# Patient Record
Sex: Male | Born: 1945 | Race: Black or African American | Hispanic: No | Smoking: Former smoker
Health system: Southern US, Community
[De-identification: ages and names within clinical notes are randomized; demographics above are authoritative.]

## PROBLEM LIST (undated history)

## (undated) DIAGNOSIS — I10 Essential (primary) hypertension: Secondary | ICD-10-CM

## (undated) DIAGNOSIS — J449 Chronic obstructive pulmonary disease, unspecified: Secondary | ICD-10-CM

---

## 2004-05-10 ENCOUNTER — Ambulatory Visit (HOSPITAL_COMMUNITY): Admission: RE | Admit: 2004-05-10 | Discharge: 2004-05-10 | Payer: Self-pay | Admitting: Gastroenterology

## 2004-05-10 ENCOUNTER — Encounter (INDEPENDENT_AMBULATORY_CARE_PROVIDER_SITE_OTHER): Payer: Self-pay | Admitting: Specialist

## 2006-10-31 ENCOUNTER — Emergency Department (HOSPITAL_COMMUNITY): Admission: EM | Admit: 2006-10-31 | Discharge: 2006-10-31 | Payer: Self-pay | Admitting: Emergency Medicine

## 2008-08-11 ENCOUNTER — Emergency Department (HOSPITAL_COMMUNITY): Admission: EM | Admit: 2008-08-11 | Discharge: 2008-08-11 | Payer: Self-pay | Admitting: Emergency Medicine

## 2011-01-19 NOTE — Op Note (Signed)
[name], Keith Morales                           ACCOUNT NO.:  [id number]   MEDICAL RECORD NO.:  [id number]                   PATIENT TYPE:  AMB   LOCATION:  ENDO                                 FACILITY:  [facility]   PHYSICIAN:  Anselmo Rod, M.D.               DATE OF BIRTH:  17-Oct-1945   DATE OF PROCEDURE:  05/10/2004  DATE OF DISCHARGE:                                 OPERATIVE REPORT   PROCEDURE:  Colonoscopy with biopsies x6.   ENDOSCOPIST:  Anselmo Rod, M.D.   INSTRUMENT USED:  Olympus video colonoscope.   INDICATIONS FOR PROCEDURE:  This 65 year old. African-American male  undergoing a screening colonoscopy.  The patient has had adenomatous polyps  removed in 1994.   PRE-PROCEDURE PREPARATION:  An informed consent was procured from the  patient and the patient was fasted for eight hours prior to the procedure  and prepped with a bottle of magnesium citrate and one gallon of GoLYTELY on  the night prior to the procedure.   PRE-PROCEDURE PHYSICAL EXAMINATION:  VITAL SIGNS:  Stable vital signs.  NECK:  Supple.  CHEST:  Clear to auscultation.  HEART:  S1, S2 regular.  ABDOMEN:  Soft with normal bowel sounds.   DESCRIPTION OF PROCEDURE:  The patient was placed in the left lateral  decubitus position and sedated with 90 mg of Demerol and 9 mg of Versed in  slow incremental doses.  Once the patient was adequately sedated and  maintained on low-flow oxygen and continuous cardiac monitoring, the Olympus  video colonoscope was advanced from the rectum to the cecum with difficulty.  There was some residual stool in the colon.  Solid stool was seen in some  parts of the colon, both in the transverse and in the left colon.  There was  some stool seen in the cecum.  Multiple washings were done.  The patient's  position was changed from the left lateral to the supine position to  mobilize the stool pool.  A small sessile polyp was biopsied x2 from the mid-  right colon.  Five small  sessile polyps were biopsied from the rectosigmoid  colon.  Prominent internal hemorrhoids were seen on retroflexion.  Small  lesions could have been missed, secondary to a relatively poor prep.   IMPRESSION:  1.  Multiple small sessile polyps, biopsied, five from the rectosigmoid      colon and one from the mid-right colon.  2.  A few early left-sided diverticula.   RECOMMENDATIONS:  1.  Await the pathology results.  2.  Avoid nonsteroidals including aspirin for the next two weeks.  3.  Outpatient followup in the next two weeks for further recommendations.  Anselmo Rod, M.D.    JNM/MEDQ  D:  05/10/2004  T:  05/10/2004  Job:  [id number]   cc:   Skeet Latch  [address]  [place]  [place] [address]  Fax: [phone]

## 2011-06-08 LAB — URINALYSIS, ROUTINE W REFLEX MICROSCOPIC
Nitrite: NEGATIVE
Protein, ur: >300 mg/dL — AB
Specific Gravity, Urine: 1.036 — ABNORMAL HIGH (ref 1.005–1.030)
Urobilinogen, UA: 1 mg/dL (ref 0.0–1.0)

## 2011-06-08 LAB — DIFFERENTIAL
Band Neutrophils: 0 % (ref 0–10)
Basophils Absolute: 0.3 10*3/uL — ABNORMAL HIGH (ref 0.0–0.1)
Basophils Relative: 1 % (ref 0–1)
Eosinophils Absolute: 0 10*3/uL (ref 0.0–0.7)
Eosinophils Relative: 0 % (ref 0–5)
Lymphs Abs: 1.9 10*3/uL (ref 0.7–4.0)
Myelocytes: 0 %
Neutro Abs: 23.4 10*3/uL — ABNORMAL HIGH (ref 1.7–7.7)
Neutrophils Relative %: 87 % — ABNORMAL HIGH (ref 43–77)
Promyelocytes Absolute: 0 %

## 2011-06-08 LAB — COMPREHENSIVE METABOLIC PANEL
AST: 22 U/L (ref 0–37)
Albumin: 3.7 g/dL (ref 3.5–5.2)
Alkaline Phosphatase: 96 U/L (ref 39–117)
Chloride: 101 mEq/L (ref 96–112)
GFR calc Af Amer: >60 mL/min (ref >60)
Potassium: 4.3 mEq/L (ref 3.5–5.1)
Sodium: 134 mEq/L — ABNORMAL LOW (ref 135–145)
Total Bilirubin: 1.8 mg/dL — ABNORMAL HIGH (ref 0.3–1.2)
Total Protein: 8.4 g/dL — ABNORMAL HIGH (ref 6.0–8.3)

## 2011-06-08 LAB — CBC
Platelets: 321 10*3/uL (ref 150–400)
RDW: 12.8 % (ref 11.5–15.5)
WBC: 27 10*3/uL — ABNORMAL HIGH (ref 4.0–10.5)

## 2011-11-23 ENCOUNTER — Emergency Department (HOSPITAL_COMMUNITY)
Admission: EM | Admit: 2011-11-23 | Discharge: 2011-11-23 | Disposition: A | Discharge status: Home / Self Care | Payer: Medicare Other | Attending: Emergency Medicine | Admitting: Emergency Medicine

## 2011-11-23 ENCOUNTER — Encounter (HOSPITAL_COMMUNITY): Payer: Self-pay | Admitting: Emergency Medicine

## 2011-11-23 DIAGNOSIS — R197 Diarrhea, unspecified: Secondary | ICD-10-CM | POA: Insufficient documentation

## 2011-11-23 DIAGNOSIS — I10 Essential (primary) hypertension: Secondary | ICD-10-CM | POA: Insufficient documentation

## 2011-11-23 HISTORY — DX: Essential (primary) hypertension: I10

## 2011-11-23 LAB — POCT I-STAT, CHEM 8
BUN: 11 mg/dL (ref 6–23)
Calcium, Ion: 1.22 mmol/L (ref 1.12–1.32)
Chloride: 110 mEq/L (ref 96–112)
Creatinine, Ser: 0.9 mg/dL (ref 0.50–1.35)
Glucose, Bld: 92 mg/dL (ref 70–99)
Potassium: 3.6 mEq/L (ref 3.5–5.1)

## 2011-11-23 MED ORDER — SODIUM CHLORIDE 0.9 % IV BOLUS (SEPSIS)
1000.0000 mL | Freq: Once | INTRAVENOUS | Status: AC
  Administered 2011-11-23: 1000 mL via INTRAVENOUS

## 2011-11-23 NOTE — ED Provider Notes (Signed)
I saw and evaluated the patient, reviewed the resident's note and I agree with the findings and plan.  Lytes checked and are nl--pt feels better after fluids, will d/c  Toy Baker, MD 11/23/11 2106

## 2011-11-23 NOTE — Discharge Instructions (Signed)
B.R.A.T. Diet Your doctor has recommended the B.R.A.T. diet for you or your child until the condition improves. This is often used to help control diarrhea and vomiting symptoms. If you or your child can tolerate clear liquids, you may have:  Bananas.   Rice.   Applesauce.   Toast (and other simple starches such as crackers, potatoes, noodles).  Be sure to avoid dairy products, meats, and fatty foods until symptoms are better. Fruit juices such as apple, grape, and prune juice can make diarrhea worse. Avoid these. Continue this diet for 2 days or as instructed by your caregiver. Document Released: 08/20/2005 Document Revised: 08/09/2011 Document Reviewed: 02/06/2007 ExitCare Patient Information 2012 [employer].  Return for any new or worsening symptoms or any other concerns.  

## 2011-11-23 NOTE — ED Provider Notes (Signed)
History     CSN: [id number]  Arrival date & time 11/23/11  1621   First MD Initiated Contact with Patient 11/23/11 1758      Chief Complaint  Patient presents with  . Diarrhea    (Consider location/radiation/quality/duration/timing/severity/associated sxs/prior treatment) HPI Cc diarrhea since last pm, multiple episodes today, no vomiting, no pain.  Sx's moderate, no aggravating or alleviating factors.   Past Medical History  Diagnosis Date  . Hypertension     History reviewed. No pertinent past surgical history.  No family history on file.  History  Substance Use Topics  . Smoking status: Never Smoker   . Smokeless tobacco: Not on file  . Alcohol Use: No      Review of Systems  Constitutional: Negative for fever and fatigue.  Respiratory: Negative for shortness of breath.   Gastrointestinal: Positive for diarrhea. Negative for nausea, vomiting and abdominal pain.  Genitourinary: Negative for dysuria.  All other systems reviewed and are negative.    Allergies  Review of patient's allergies indicates no known allergies.  Home Medications   Current Outpatient Rx  Name Route Sig Dispense Refill  . AMLODIPINE BESY-BENAZEPRIL HCL 10-40 MG PO CAPS Oral Take 1 capsule by mouth daily.      BP 128/69  Pulse 87  Temp(Src) 99.1 F (37.3 C) (Oral)  Resp 14  SpO2 99%RA wnl  Physical Exam  Nursing note and vitals reviewed. Constitutional: He appears well-developed and well-nourished.  HENT:  Head: Normocephalic and atraumatic.  Eyes: Right eye exhibits no discharge. Left eye exhibits no discharge.  Neck: Normal range of motion. Neck supple.  Cardiovascular: Normal rate, regular rhythm and normal heart sounds.   Pulmonary/Chest: Effort normal and breath sounds normal.  Abdominal: Soft. There is no tenderness.  Musculoskeletal: Normal range of motion. He exhibits no tenderness.  Neurological: He is alert.  Skin: Skin is warm and dry.  Psychiatric: He has a  normal mood and affect. His behavior is normal.    ED Course  Procedures (including critical care time)   Labs Reviewed  POCT I-STAT, CHEM 8  LAB REPORT - SCANNED   No results found.   1. Diarrhea       MDM  Pt is in nad, afvss, nontoxic appearing, exam and hx as above. Pt here with multiple episodes of diarrhea, no vomiting, abd benign, will check elytes, giving ivf's.     Pt feels better after fluids, results d/w pt, return warnings given.    Elijio Miles, MD 11/24/11 1534

## 2011-11-23 NOTE — ED Notes (Signed)
Onset today nausea and diarrhea loose stool multiple episodes denies any pain ax4.

## 2011-11-23 NOTE — ED Notes (Signed)
Patient states he is having diarrhea and nausea starting last nigh and today. Last episode of diarrhea was at 1500 today. Patient denies any pain in general and no SOB. Family at the bedside and patient resting with NAD at this time.

## 2011-11-25 NOTE — ED Provider Notes (Signed)
I saw and evaluated the patient, reviewed the resident's note and I agree with the findings and plan.  Toy Baker, MD 11/25/11 [phone]

## 2012-11-11 ENCOUNTER — Encounter (HOSPITAL_COMMUNITY): Payer: Self-pay | Admitting: Physical Medicine and Rehabilitation

## 2012-11-11 ENCOUNTER — Emergency Department (HOSPITAL_COMMUNITY)
Admission: EM | Admit: 2012-11-11 | Discharge: 2012-11-11 | Disposition: A | Discharge status: Home / Self Care | Payer: No Typology Code available for payment source | Attending: Emergency Medicine | Admitting: Emergency Medicine

## 2012-11-11 DIAGNOSIS — Z79899 Other long term (current) drug therapy: Secondary | ICD-10-CM | POA: Insufficient documentation

## 2012-11-11 DIAGNOSIS — S46909A Unspecified injury of unspecified muscle, fascia and tendon at shoulder and upper arm level, unspecified arm, initial encounter: Secondary | ICD-10-CM | POA: Insufficient documentation

## 2012-11-11 DIAGNOSIS — I10 Essential (primary) hypertension: Secondary | ICD-10-CM | POA: Insufficient documentation

## 2012-11-11 DIAGNOSIS — Y92410 Unspecified street and highway as the place of occurrence of the external cause: Secondary | ICD-10-CM | POA: Insufficient documentation

## 2012-11-11 DIAGNOSIS — S0993XA Unspecified injury of face, initial encounter: Secondary | ICD-10-CM | POA: Insufficient documentation

## 2012-11-11 DIAGNOSIS — Y9389 Activity, other specified: Secondary | ICD-10-CM | POA: Insufficient documentation

## 2012-11-11 MED ORDER — IBUPROFEN 800 MG PO TABS: 800.0000 mg | ORAL_TABLET | Freq: Three times a day (TID) | ORAL | Status: DC
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
## 2012-11-11 NOTE — ED Provider Notes (Signed)
Medical screening examination/treatment/procedure(s) were performed by non-physician practitioner and as supervising physician I was immediately available for consultation/collaboration.   Carleene Cooper III, MD 11/11/12 [phone]
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
## 2012-12-02 ENCOUNTER — Encounter (HOSPITAL_COMMUNITY): Payer: Self-pay | Admitting: *Deleted

## 2012-12-02 ENCOUNTER — Emergency Department (HOSPITAL_COMMUNITY)
Admission: EM | Admit: 2012-12-02 | Discharge: 2012-12-02 | Disposition: A | Discharge status: Home / Self Care | Payer: Medicare Other | Attending: Emergency Medicine | Admitting: Emergency Medicine

## 2012-12-02 ENCOUNTER — Emergency Department (HOSPITAL_COMMUNITY): Payer: Medicare Other

## 2012-12-02 DIAGNOSIS — Z79899 Other long term (current) drug therapy: Secondary | ICD-10-CM | POA: Insufficient documentation

## 2012-12-02 DIAGNOSIS — S20219A Contusion of unspecified front wall of thorax, initial encounter: Secondary | ICD-10-CM | POA: Insufficient documentation

## 2012-12-02 DIAGNOSIS — S20211A Contusion of right front wall of thorax, initial encounter: Secondary | ICD-10-CM

## 2012-12-02 DIAGNOSIS — I10 Essential (primary) hypertension: Secondary | ICD-10-CM | POA: Insufficient documentation

## 2012-12-02 MED ORDER — HYDROCODONE-ACETAMINOPHEN 5-325 MG PO TABS: 1.0000 | ORAL_TABLET | Freq: Four times a day (QID) | ORAL | Status: DC | PRN
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
## 2012-12-02 NOTE — ED Provider Notes (Signed)
Medical screening examination/treatment/procedure(s) were performed by non-physician practitioner and as supervising physician I was immediately available for consultation/collaboration.   Gavin Pound. Oletta Lamas, MD 12/02/12 2156
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
## 2014-09-09 ENCOUNTER — Emergency Department (HOSPITAL_COMMUNITY): Payer: Medicare Other

## 2014-09-09 ENCOUNTER — Encounter (HOSPITAL_COMMUNITY): Payer: Self-pay | Admitting: Cardiology

## 2014-09-09 ENCOUNTER — Emergency Department (HOSPITAL_COMMUNITY)
Admission: EM | Admit: 2014-09-09 | Discharge: 2014-09-09 | Disposition: A | Discharge status: Home / Self Care | Payer: Medicare Other | Attending: Emergency Medicine | Admitting: Emergency Medicine

## 2014-09-09 DIAGNOSIS — Y9389 Activity, other specified: Secondary | ICD-10-CM | POA: Diagnosis not present

## 2014-09-09 DIAGNOSIS — S99921A Unspecified injury of right foot, initial encounter: Secondary | ICD-10-CM | POA: Diagnosis present

## 2014-09-09 DIAGNOSIS — I10 Essential (primary) hypertension: Secondary | ICD-10-CM | POA: Diagnosis not present

## 2014-09-09 DIAGNOSIS — S92354A Nondisplaced fracture of fifth metatarsal bone, right foot, initial encounter for closed fracture: Secondary | ICD-10-CM | POA: Insufficient documentation

## 2014-09-09 DIAGNOSIS — Y9289 Other specified places as the place of occurrence of the external cause: Secondary | ICD-10-CM | POA: Insufficient documentation

## 2014-09-09 DIAGNOSIS — S92301A Fracture of unspecified metatarsal bone(s), right foot, initial encounter for closed fracture: Secondary | ICD-10-CM

## 2014-09-09 DIAGNOSIS — Y998 Other external cause status: Secondary | ICD-10-CM | POA: Diagnosis not present

## 2014-09-09 DIAGNOSIS — W010XXA Fall on same level from slipping, tripping and stumbling without subsequent striking against object, initial encounter: Secondary | ICD-10-CM | POA: Diagnosis not present

## 2014-09-09 DIAGNOSIS — Z79899 Other long term (current) drug therapy: Secondary | ICD-10-CM | POA: Diagnosis not present

## 2014-09-09 NOTE — ED Notes (Signed)
Pt reports that he slipped on christmas eve and hurt this right foot. Pt reports swelling and pain with walking.

## 2014-09-09 NOTE — Discharge Instructions (Signed)
Metatarsal Fracture, Undisplaced  A metatarsal fracture is a break in the bone(s) of the foot. These are the bones of the foot that connect your toes to the bones of the ankle.  DIAGNOSIS   The diagnoses of these fractures are usually made with X-rays. If there are problems in the forefoot and x-rays are normal a later bone scan will usually make the diagnosis.   TREATMENT AND HOME CARE INSTRUCTIONS  · Treatment may or may not include a cast or walking shoe. When casts are needed the use is usually for short periods of time so as not to slow down healing with muscle wasting (atrophy).  · Activities should be stopped until further advised by your caregiver.  · Wear shoes with adequate shock absorbing capabilities and stiff soles.  · Alternative exercise may be undertaken while waiting for healing. These may include bicycling and swimming, or as your caregiver suggests.  · It is important to keep all follow-up visits or specialty referrals. The failure to keep these appointments could result in improper bone healing and chronic pain or disability.  · Warning: Do not drive a car or operate a motor vehicle until your caregiver specifically tells you it is safe to do so.  IF YOU DO NOT HAVE A CAST OR SPLINT:  · You may walk on your injured foot as tolerated or advised.  · Do not put any weight on your injured foot for as long as directed by your caregiver. Slowly increase the amount of time you walk on the foot as the pain allows or as advised.  · Use crutches until you can bear weight without pain. A gradual increase in weight bearing may help.  · Apply ice to the injury for 15-20 minutes each hour while awake for the first 2 days. Put the ice in a plastic bag and place a towel between the bag of ice and your skin.  · Only take over-the-counter or prescription medicines for pain, discomfort, or fever as directed by your caregiver.  SEEK IMMEDIATE MEDICAL CARE IF:   · Your cast gets damaged or breaks.  · You have  continued severe pain or more swelling than you did before the cast was put on, or the pain is not controlled with medications.  · Your skin or nails below the injury turn blue or grey, or feel cold or numb.  · There is a bad smell, or new stains or pus-like (purulent) drainage coming from the cast.  MAKE SURE YOU:   · Understand these instructions.  · Will watch your condition.  · Will get help right away if you are not doing well or get worse.  Document Released: 05/12/2002 Document Revised: 11/12/2011 Document Reviewed: 04/02/2008  ExitCare® Patient Information ©2015 [employer]. This information is not intended to replace advice given to you by your health care provider. Make sure you discuss any questions you have with your health care provider.

## 2014-09-09 NOTE — ED Provider Notes (Signed)
CSN: [id number]     Arrival date & time 09/09/14  1208 History  This chart was scribed for non-physician practitioner, Margarita Mail, PA-C, working with Orlie Dakin, MD by Ladene Artist, ED Scribe. This patient was seen in room TR09C/TR09C and the patient's care was started at 2:50 PM.   Chief Complaint  Patient presents with  . Foot Pain   The history is provided by the patient. No language interpreter was used.   HPI Comments: Keith Morales is a 69 y.o. male, with a h/o HTN, who presents to the Emergency Department complaining of constant R foot pain onset 2 weeks ago. Pt states that he slipped and fell on 08/28/14. He reports twisting his foot upon falling. Pt reports associated joint swelling to the area. Pain is unchanged with driving.   PCP: Miguel Aschoff, MD  Past Medical History  Diagnosis Date  . Hypertension    History reviewed. No pertinent past surgical history. History reviewed. No pertinent family history. History  Substance Use Topics  . Smoking status: Never Smoker   . Smokeless tobacco: Not on file  . Alcohol Use: No    Review of Systems  Musculoskeletal: Positive for joint swelling and arthralgias.   Allergies  Review of patient's allergies indicates no known allergies.  Home Medications   Prior to Admission medications   Medication Sig Start Date End Date Taking? Authorizing Provider  amLODipine-benazepril (LOTREL) 10-40 MG per capsule Take 1 capsule by mouth daily.    Historical Provider, MD  HYDROcodone-acetaminophen (NORCO/VICODIN) 5-325 MG per tablet Take 1-2 tablets by mouth every 6 (six) hours as needed for pain. 12/02/12   Heather Laisure, PA-C  ibuprofen (ADVIL,MOTRIN) 800 MG tablet Take 800 mg by mouth every 8 (eight) hours as needed for pain.    Historical Provider, MD   BP 148/115 mmHg  Pulse 71  Temp(Src) 98.6 F (37 C) (Oral)  Resp 18  Ht 5\' 7"  (1.702 m)  Wt 140 lb (63.504 kg)  BMI 21.92 kg/m2  SpO2 99% Physical Exam  Constitutional:  He is oriented to person, place, and time. He appears well-developed and well-nourished. No distress.  HENT:  Head: Normocephalic and atraumatic.  Eyes: Conjunctivae and EOM are normal.  Neck: Neck supple.  Cardiovascular: Normal rate.   Pulmonary/Chest: Effort normal.  Musculoskeletal: Normal range of motion.  Tenderness to palpation over the base of the 5th metatarsal. No swelling. Able to ambulate.   Neurological: He is alert and oriented to person, place, and time.  Skin: Skin is warm and dry.  Psychiatric: He has a normal mood and affect. His behavior is normal.  Nursing note and vitals reviewed.  ED Course  Procedures (including critical care time) DIAGNOSTIC STUDIES: Oxygen Saturation is 99% on RA, normal by my interpretation.    COORDINATION OF CARE: 2:54 PM-Discussed treatment plan which includes XR, cam walker and follow-up with orthopedist with pt at bedside and pt agreed to plan.   Labs Review Labs Reviewed - No data to display  Imaging Review Dg Foot Complete Right  09/09/2014   CLINICAL DATA:  Tripped and fell, injuring the right foot on 08/26/2014. Persistent pain involving the lateral foot. Initial encounter.  EXAM: RIGHT FOOT COMPLETE - 3+ VIEW  COMPARISON:  None.  FINDINGS: Nondisplaced fracture involving the base of the 5th metatarsal. No other fractures. Joint space narrowing and associated hypertrophic spurring involving the 1st MTP joint without associated erosions. Narrowing of IP joints in multiple toes. Remaining joint spaces well preserved. Bone  mineral density well-preserved. Large accessory ossicle adjacent to the tarsal navicular.  IMPRESSION: 1. Acute/subacute traumatic fracture involving the base the 5th metatarsal. 2. 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 Mild osteoarthritis involving the IP joints of several toes.   Electronically Signed   By: Evangeline Dakin M.D.   On: 09/09/2014 13:47    EKG Interpretation None      MDM   Final  diagnoses:  Fracture of 5th metatarsal, right, closed, initial encounter   Patient with subacute fracture of the base of the 5th metatarsal. No swelling and ambulating well. This fracture appears stable and well aligned. Patient will be placed in a Cam walker boot. He is to follow up with the orthopod.    I personally performed the services described in this documentation, which was scribed in my presence. The recorded information has been reviewed and is accurate.     Margarita Mail, PA-C 09/09/14 [place], MD 09/10/14 1745

## 2014-09-09 NOTE — ED Notes (Signed)
Pt comfortable with discharge and follow up instructions. Pt declines wheelchair, escorted to waiting area by this RN. No prescriptions. 

## 2020-04-03 ENCOUNTER — Encounter (HOSPITAL_COMMUNITY): Payer: Self-pay

## 2020-04-03 ENCOUNTER — Emergency Department (HOSPITAL_COMMUNITY): Payer: Medicare Other

## 2020-04-03 ENCOUNTER — Emergency Department (HOSPITAL_COMMUNITY)
Admission: EM | Admit: 2020-04-03 | Discharge: 2020-04-04 | Disposition: A | Discharge status: Home / Self Care | Payer: Medicare Other | Attending: Emergency Medicine | Admitting: Emergency Medicine

## 2020-04-03 DIAGNOSIS — Z5321 Procedure and treatment not carried out due to patient leaving prior to being seen by health care provider: Secondary | ICD-10-CM | POA: Insufficient documentation

## 2020-04-03 DIAGNOSIS — R109 Unspecified abdominal pain: Secondary | ICD-10-CM | POA: Diagnosis not present

## 2020-04-03 DIAGNOSIS — R079 Chest pain, unspecified: Secondary | ICD-10-CM | POA: Insufficient documentation

## 2020-04-03 DIAGNOSIS — R630 Anorexia: Secondary | ICD-10-CM | POA: Insufficient documentation

## 2020-04-03 LAB — CBC
HCT: 39.3 % (ref 39.0–52.0)
Hemoglobin: 12.7 g/dL — ABNORMAL LOW (ref 13.0–17.0)
MCH: 29.5 pg (ref 26.0–34.0)
MCHC: 32.3 g/dL (ref 30.0–36.0)
MCV: 91.4 fL (ref 80.0–100.0)
Platelets: 332 10*3/uL (ref 150–400)
RBC: 4.3 MIL/uL (ref 4.22–5.81)
RDW: 12.5 % (ref 11.5–15.5)
WBC: 7 10*3/uL (ref 4.0–10.5)
nRBC: 0 % (ref 0.0–0.2)

## 2020-04-03 NOTE — ED Triage Notes (Signed)
Pt arrives POV for eval of chest pain, abd pain loss of appetite x 3 days. Pt reports he was seen recently and given meds at that time which he states has not helped. Reports started today w/ chest pain approx 3 hours PTA.

## 2020-04-04 DIAGNOSIS — R079 Chest pain, unspecified: Secondary | ICD-10-CM | POA: Diagnosis not present

## 2020-04-04 LAB — BASIC METABOLIC PANEL
Anion gap: 11 (ref 5–15)
BUN: 13 mg/dL (ref 8–23)
CO2: 24 mmol/L (ref 22–32)
Calcium: 9.2 mg/dL (ref 8.9–10.3)
Chloride: 98 mmol/L (ref 98–111)
Creatinine, Ser: 1.12 mg/dL (ref 0.61–1.24)
GFR calc Af Amer: >60 mL/min (ref >60)
GFR calc non Af Amer: >60 mL/min (ref >60)
Glucose, Bld: 119 mg/dL — ABNORMAL HIGH (ref 70–99)
Potassium: 4.1 mmol/L (ref 3.5–5.1)
Sodium: 133 mmol/L — ABNORMAL LOW (ref 135–145)

## 2020-04-04 LAB — TROPONIN I (HIGH SENSITIVITY)
Troponin I (High Sensitivity): 5 ng/L (ref <18)
Troponin I (High Sensitivity): 4 ng/L (ref <18)

## 2020-04-04 NOTE — ED Notes (Signed)
Pt checked out AMA. 

## 2020-04-04 NOTE — ED Notes (Signed)
Pt called x 3  No answer. 

## 2020-04-09 ENCOUNTER — Emergency Department (HOSPITAL_COMMUNITY): Payer: Medicare Other

## 2020-04-09 ENCOUNTER — Inpatient Hospital Stay (HOSPITAL_COMMUNITY)
Admission: EM | Admit: 2020-04-09 | Discharge: 2020-04-16 | DRG: 377 | Disposition: A | Discharge status: Home / Self Care | Payer: Medicare Other | Attending: Internal Medicine | Admitting: Internal Medicine

## 2020-04-09 ENCOUNTER — Encounter (HOSPITAL_COMMUNITY): Payer: Self-pay | Admitting: Radiology

## 2020-04-09 ENCOUNTER — Other Ambulatory Visit: Payer: Self-pay

## 2020-04-09 DIAGNOSIS — R339 Retention of urine, unspecified: Secondary | ICD-10-CM | POA: Diagnosis not present

## 2020-04-09 DIAGNOSIS — K922 Gastrointestinal hemorrhage, unspecified: Secondary | ICD-10-CM | POA: Diagnosis not present

## 2020-04-09 DIAGNOSIS — D62 Acute posthemorrhagic anemia: Secondary | ICD-10-CM | POA: Diagnosis present

## 2020-04-09 DIAGNOSIS — R195 Other fecal abnormalities: Secondary | ICD-10-CM | POA: Diagnosis present

## 2020-04-09 DIAGNOSIS — K648 Other hemorrhoids: Secondary | ICD-10-CM | POA: Diagnosis present

## 2020-04-09 DIAGNOSIS — D120 Benign neoplasm of cecum: Secondary | ICD-10-CM | POA: Diagnosis not present

## 2020-04-09 DIAGNOSIS — D509 Iron deficiency anemia, unspecified: Secondary | ICD-10-CM | POA: Diagnosis present

## 2020-04-09 DIAGNOSIS — I70222 Atherosclerosis of native arteries of extremities with rest pain, left leg: Secondary | ICD-10-CM | POA: Diagnosis present

## 2020-04-09 DIAGNOSIS — D649 Anemia, unspecified: Secondary | ICD-10-CM

## 2020-04-09 DIAGNOSIS — Z681 Body mass index (BMI) 19 or less, adult: Secondary | ICD-10-CM

## 2020-04-09 DIAGNOSIS — K5730 Diverticulosis of large intestine without perforation or abscess without bleeding: Secondary | ICD-10-CM | POA: Diagnosis not present

## 2020-04-09 DIAGNOSIS — D72829 Elevated white blood cell count, unspecified: Secondary | ICD-10-CM | POA: Diagnosis not present

## 2020-04-09 DIAGNOSIS — I739 Peripheral vascular disease, unspecified: Secondary | ICD-10-CM

## 2020-04-09 DIAGNOSIS — G8929 Other chronic pain: Secondary | ICD-10-CM | POA: Diagnosis present

## 2020-04-09 DIAGNOSIS — N1330 Unspecified hydronephrosis: Secondary | ICD-10-CM | POA: Diagnosis not present

## 2020-04-09 DIAGNOSIS — K254 Chronic or unspecified gastric ulcer with hemorrhage: Principal | ICD-10-CM | POA: Diagnosis present

## 2020-04-09 DIAGNOSIS — E871 Hypo-osmolality and hyponatremia: Secondary | ICD-10-CM | POA: Diagnosis present

## 2020-04-09 DIAGNOSIS — Z79899 Other long term (current) drug therapy: Secondary | ICD-10-CM

## 2020-04-09 DIAGNOSIS — D126 Benign neoplasm of colon, unspecified: Secondary | ICD-10-CM | POA: Diagnosis not present

## 2020-04-09 DIAGNOSIS — R52 Pain, unspecified: Secondary | ICD-10-CM | POA: Diagnosis not present

## 2020-04-09 DIAGNOSIS — K449 Diaphragmatic hernia without obstruction or gangrene: Secondary | ICD-10-CM | POA: Diagnosis present

## 2020-04-09 DIAGNOSIS — E43 Unspecified severe protein-calorie malnutrition: Secondary | ICD-10-CM | POA: Diagnosis not present

## 2020-04-09 DIAGNOSIS — R634 Abnormal weight loss: Secondary | ICD-10-CM | POA: Diagnosis present

## 2020-04-09 DIAGNOSIS — R233 Spontaneous ecchymoses: Secondary | ICD-10-CM | POA: Diagnosis present

## 2020-04-09 DIAGNOSIS — R338 Other retention of urine: Secondary | ICD-10-CM

## 2020-04-09 DIAGNOSIS — K2950 Unspecified chronic gastritis without bleeding: Secondary | ICD-10-CM | POA: Diagnosis present

## 2020-04-09 DIAGNOSIS — I10 Essential (primary) hypertension: Secondary | ICD-10-CM | POA: Diagnosis present

## 2020-04-09 DIAGNOSIS — M545 Low back pain: Secondary | ICD-10-CM | POA: Diagnosis present

## 2020-04-09 DIAGNOSIS — R202 Paresthesia of skin: Secondary | ICD-10-CM

## 2020-04-09 DIAGNOSIS — Z20822 Contact with and (suspected) exposure to covid-19: Secondary | ICD-10-CM | POA: Diagnosis present

## 2020-04-09 DIAGNOSIS — D122 Benign neoplasm of ascending colon: Secondary | ICD-10-CM | POA: Diagnosis present

## 2020-04-09 DIAGNOSIS — K621 Rectal polyp: Secondary | ICD-10-CM | POA: Diagnosis present

## 2020-04-09 DIAGNOSIS — I70209 Unspecified atherosclerosis of native arteries of extremities, unspecified extremity: Secondary | ICD-10-CM | POA: Diagnosis present

## 2020-04-09 DIAGNOSIS — J449 Chronic obstructive pulmonary disease, unspecified: Secondary | ICD-10-CM | POA: Diagnosis not present

## 2020-04-09 DIAGNOSIS — E876 Hypokalemia: Secondary | ICD-10-CM | POA: Diagnosis not present

## 2020-04-09 DIAGNOSIS — N401 Enlarged prostate with lower urinary tract symptoms: Secondary | ICD-10-CM | POA: Diagnosis present

## 2020-04-09 DIAGNOSIS — J439 Emphysema, unspecified: Secondary | ICD-10-CM | POA: Diagnosis present

## 2020-04-09 DIAGNOSIS — I639 Cerebral infarction, unspecified: Secondary | ICD-10-CM | POA: Insufficient documentation

## 2020-04-09 DIAGNOSIS — M79605 Pain in left leg: Secondary | ICD-10-CM

## 2020-04-09 DIAGNOSIS — F17210 Nicotine dependence, cigarettes, uncomplicated: Secondary | ICD-10-CM | POA: Diagnosis present

## 2020-04-09 HISTORY — DX: Chronic obstructive pulmonary disease, unspecified: J44.9

## 2020-04-09 LAB — CBG MONITORING, ED: Glucose-Capillary: 169 mg/dL — ABNORMAL HIGH (ref 70–99)

## 2020-04-09 LAB — COMPREHENSIVE METABOLIC PANEL
ALT: 10 U/L (ref 0–44)
AST: 15 U/L (ref 15–41)
Albumin: 2.8 g/dL — ABNORMAL LOW (ref 3.5–5.0)
Alkaline Phosphatase: 44 U/L (ref 38–126)
Anion gap: 13 (ref 5–15)
BUN: 34 mg/dL — ABNORMAL HIGH (ref 8–23)
CO2: 20 mmol/L — ABNORMAL LOW (ref 22–32)
Calcium: 8.4 mg/dL — ABNORMAL LOW (ref 8.9–10.3)
Chloride: 97 mmol/L — ABNORMAL LOW (ref 98–111)
Creatinine, Ser: 1.08 mg/dL (ref 0.61–1.24)
GFR calc Af Amer: >60 mL/min (ref >60)
GFR calc non Af Amer: >60 mL/min (ref >60)
Glucose, Bld: 167 mg/dL — ABNORMAL HIGH (ref 70–99)
Potassium: 3.5 mmol/L (ref 3.5–5.1)
Sodium: 130 mmol/L — ABNORMAL LOW (ref 135–145)
Total Bilirubin: 0.7 mg/dL (ref 0.3–1.2)
Total Protein: 5.2 g/dL — ABNORMAL LOW (ref 6.5–8.1)

## 2020-04-09 LAB — DIFFERENTIAL
Abs Immature Granulocytes: 0.09 10*3/uL — ABNORMAL HIGH (ref 0.00–0.07)
Basophils Absolute: 0 10*3/uL (ref 0.0–0.1)
Basophils Relative: 0 %
Eosinophils Absolute: 0 10*3/uL (ref 0.0–0.5)
Eosinophils Relative: 0 %
Immature Granulocytes: 1 %
Lymphocytes Relative: 17 %
Lymphs Abs: 1.5 10*3/uL (ref 0.7–4.0)
Monocytes Absolute: 0.9 10*3/uL (ref 0.1–1.0)
Monocytes Relative: 10 %
Neutro Abs: 6.3 10*3/uL (ref 1.7–7.7)
Neutrophils Relative %: 72 %

## 2020-04-09 LAB — PROTIME-INR
INR: 1.1 (ref 0.8–1.2)
Prothrombin Time: 13.7 seconds (ref 11.4–15.2)

## 2020-04-09 LAB — CBC
HCT: 20.8 % — ABNORMAL LOW (ref 39.0–52.0)
Hemoglobin: 7 g/dL — ABNORMAL LOW (ref 13.0–17.0)
MCH: 30.3 pg (ref 26.0–34.0)
MCHC: 33.7 g/dL (ref 30.0–36.0)
MCV: 90 fL (ref 80.0–100.0)
Platelets: 238 10*3/uL (ref 150–400)
RBC: 2.31 MIL/uL — ABNORMAL LOW (ref 4.22–5.81)
RDW: 13 % (ref 11.5–15.5)
WBC: 8.8 10*3/uL (ref 4.0–10.5)
nRBC: 0 % (ref 0.0–0.2)

## 2020-04-09 LAB — I-STAT CHEM 8, ED
BUN: 35 mg/dL — ABNORMAL HIGH (ref 8–23)
Calcium, Ion: 1.18 mmol/L (ref 1.15–1.40)
Chloride: 96 mmol/L — ABNORMAL LOW (ref 98–111)
Creatinine, Ser: 1 mg/dL (ref 0.61–1.24)
Glucose, Bld: 167 mg/dL — ABNORMAL HIGH (ref 70–99)
HCT: 21 % — ABNORMAL LOW (ref 39.0–52.0)
Hemoglobin: 7.1 g/dL — ABNORMAL LOW (ref 13.0–17.0)
Potassium: 3.6 mmol/L (ref 3.5–5.1)
Sodium: 134 mmol/L — ABNORMAL LOW (ref 135–145)
TCO2: 23 mmol/L (ref 22–32)

## 2020-04-09 LAB — PREPARE RBC (CROSSMATCH)

## 2020-04-09 LAB — APTT: aPTT: 23 seconds — ABNORMAL LOW (ref 24–36)

## 2020-04-09 LAB — POC OCCULT BLOOD, ED: Fecal Occult Bld: POSITIVE — AB

## 2020-04-09 LAB — SARS CORONAVIRUS 2 BY RT PCR (HOSPITAL ORDER, PERFORMED IN ~~LOC~~ HOSPITAL LAB): SARS Coronavirus 2: NEGATIVE

## 2020-04-09 LAB — ABO/RH: ABO/RH(D): A POS

## 2020-04-09 MED ORDER — ACETAMINOPHEN 650 MG RE SUPP: 650.0000 mg | Freq: Four times a day (QID) | RECTAL | Status: DC | PRN

## 2020-04-09 MED ORDER — MORPHINE SULFATE (PF) 2 MG/ML IV SOLN
2.0000 mg | INTRAVENOUS | Status: DC | PRN
  Administered 2020-04-09 – 2020-04-13 (×11): 2 mg via INTRAVENOUS
  Filled 2020-04-09 (×12): qty 1

## 2020-04-09 MED ORDER — ONDANSETRON HCL 4 MG/2ML IJ SOLN: 4.0000 mg | Freq: Four times a day (QID) | INTRAMUSCULAR | Status: DC | PRN

## 2020-04-09 MED ORDER — ONDANSETRON HCL 4 MG PO TABS: 4.0000 mg | ORAL_TABLET | Freq: Four times a day (QID) | ORAL | Status: DC | PRN

## 2020-04-09 MED ORDER — SODIUM CHLORIDE 0.9 % IV SOLN: INTRAVENOUS | Status: DC

## 2020-04-09 MED ORDER — IOHEXOL 350 MG/ML SOLN
50.0000 mL | Freq: Once | INTRAVENOUS | Status: AC | PRN
  Administered 2020-04-09: 50 mL via INTRAVENOUS

## 2020-04-09 MED ORDER — SODIUM CHLORIDE 0.9% FLUSH
3.0000 mL | Freq: Once | INTRAVENOUS | Status: AC
Start: 2020-04-09 — End: 2020-04-14
  Administered 2020-04-14: 3 mL via INTRAVENOUS

## 2020-04-09 MED ORDER — PANTOPRAZOLE SODIUM 40 MG IV SOLR
40.0000 mg | Freq: Two times a day (BID) | INTRAVENOUS | Status: DC
  Administered 2020-04-09 – 2020-04-13 (×8): 40 mg via INTRAVENOUS
  Filled 2020-04-09 (×8): qty 40

## 2020-04-09 MED ORDER — SODIUM CHLORIDE 0.9 % IV SOLN: 10.0000 mL/h | Freq: Once | INTRAVENOUS | Status: DC

## 2020-04-09 MED ORDER — PANTOPRAZOLE SODIUM 40 MG IV SOLR
40.0000 mg | Freq: Once | INTRAVENOUS | Status: AC
  Administered 2020-04-09: 40 mg via INTRAVENOUS
  Filled 2020-04-09: qty 40

## 2020-04-09 MED ORDER — FERROUS SULFATE 325 (65 FE) MG PO TABS
325.0000 mg | ORAL_TABLET | Freq: Every day | ORAL | Status: DC
  Administered 2020-04-09 – 2020-04-16 (×8): 325 mg via ORAL
  Filled 2020-04-09 (×8): qty 1

## 2020-04-09 MED ORDER — ALBUTEROL SULFATE (2.5 MG/3ML) 0.083% IN NEBU: 2.5000 mg | INHALATION_SOLUTION | Freq: Four times a day (QID) | RESPIRATORY_TRACT | Status: DC | PRN

## 2020-04-09 MED ORDER — DEXTROSE 50 % IV SOLN: 25.0000 mL | Freq: Once | INTRAVENOUS | Status: DC

## 2020-04-09 MED ORDER — SODIUM CHLORIDE 0.9% FLUSH
3.0000 mL | Freq: Two times a day (BID) | INTRAVENOUS | Status: DC
  Administered 2020-04-10 – 2020-04-16 (×12): 3 mL via INTRAVENOUS

## 2020-04-09 MED ORDER — ACETAMINOPHEN 325 MG PO TABS
650.0000 mg | ORAL_TABLET | Freq: Four times a day (QID) | ORAL | Status: DC | PRN
  Administered 2020-04-09 – 2020-04-14 (×2): 650 mg via ORAL
  Filled 2020-04-09 (×3): qty 2

## 2020-04-09 NOTE — ED Triage Notes (Signed)
Pt BIB EMS with Right leg weakness x2 weeks, awoke 0800 this am with Right leg numbness and gait changes. A/o x4   20g LAC  BP 108/77 HR 100 98% RA CBG 226 97 temp  CBG here 169

## 2020-04-09 NOTE — Consult Note (Signed)
[facility]  Patient name: Keith Morales MRN: [id number] DOB: 27-Dec-1945 Sex: male   REQUESTING PROVIDER:    ER< Dr. Jeanell Sparrow   REASON FOR CONSULT:    Left leg numbness  HISTORY OF PRESENT ILLNESS:   Keith Morales is a 74 y.o. male, who came to the ER this am as  A code stroke because he began having left leg numbness around 8 am.  So far, his work up has consisted of a CT head which was  Unremarkable.  MRI is pending.    The patient has been having abdominal pain and weight loss.  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  He is no hypotensive and anemic with blood stools.  There is now concern that his leg numbness is related to arterial insufficiency.  His blood pressure has come up as he is awaiting blood transfusion, and he now says his leg numbness is improving.  The patient also suffers from COPD secondary to longstanding tobacco abuse.  He is medically managed for hypertension.  PAST MEDICAL HISTORY    Past Medical History:  Diagnosis Date  . COPD (chronic obstructive pulmonary disease) ([place])   . Hypertension      FAMILY HISTORY   History reviewed. No pertinent family history.  SOCIAL HISTORY:   Social History   Socioeconomic History  . Marital status: Divorced    Spouse name: Not on file  . Number of children: Not on file  . Years of education: Not on file  . Highest education level: Not on file  Occupational History  . Not on file  Tobacco Use  . Smoking status: Current Some Day Smoker    Types: Cigarettes, Cigars  . Smokeless tobacco: Never Used  Substance and Sexual Activity  . Alcohol use: No  . Drug use: No  . Sexual activity: Not on file  Other Topics Concern  . Not on file  Social History Narrative  . Not on file   Social Determinants of Health   Financial Resource Strain:   . Difficulty of Paying Living Expenses:   Food Insecurity:   . Worried About  Charity fundraiser in the Last Year:   . Arboriculturist in the Last Year:   Transportation Needs:   . Film/video editor (Medical):   Marland Kitchen Lack of Transportation (Non-Medical):   Physical Activity:   . Days of Exercise per Week:   . Minutes of Exercise per Session:   Stress:   . Feeling of Stress :   Social Connections:   . Frequency of Communication with Friends and Family:   . Frequency of Social Gatherings with Friends and Family:   . Attends Religious Services:   . Active Member of Clubs or Organizations:   . Attends Archivist Meetings:   Marland Kitchen Marital Status:   Intimate Partner Violence:   . Fear of Current or Ex-Partner:   . Emotionally Abused:   Marland Kitchen Physically Abused:   . Sexually Abused:     ALLERGIES:    No Known Allergies  CURRENT MEDICATIONS:    Current Facility-Administered Medications  Medication Dose Route Frequency Provider Last Rate Last Admin  . 0.9 %  sodium chloride infusion  10 mL/hr Intravenous Once Pattricia Boss, MD      . 0.9 %  sodium chloride infusion   Intravenous Continuous Smith, Rondell A, MD      . acetaminophen (TYLENOL) tablet 650  mg  650 mg Oral Q6H PRN Norval Morton, MD       Or  . acetaminophen (TYLENOL) suppository 650 mg  650 mg Rectal Q6H PRN Smith, Rondell A, MD      . albuterol (PROVENTIL) (2.5 MG/3ML) 0.083% nebulizer solution 2.5 mg  2.5 mg Nebulization Q6H PRN Tamala Julian, Rondell A, MD      . ferrous sulfate tablet 325 mg  325 mg Oral Daily Smith, Rondell A, MD      . morphine 2 MG/ML injection 2 mg  2 mg Intravenous Q3H PRN Tamala Julian, Rondell A, MD      . ondansetron (ZOFRAN) tablet 4 mg  4 mg Oral Q6H PRN Fuller Plan A, MD       Or  . ondansetron (ZOFRAN) injection 4 mg  4 mg Intravenous Q6H PRN Smith, Rondell A, MD      . pantoprazole (PROTONIX) injection 40 mg  40 mg Intravenous Q12H Smith, Rondell A, MD      . sodium chloride flush (NS) 0.9 % injection 3 mL  3 mL Intravenous Once Pattricia Boss, MD      . sodium  chloride flush (NS) 0.9 % injection 3 mL  3 mL Intravenous Q12H Norval Morton, MD       Current Outpatient Medications  Medication Sig Dispense Refill  . ferrous sulfate 325 (65 FE) MG tablet Take 1 tablet by mouth daily.    Marland Kitchen PROAIR HFA 108 (90 Base) MCG/ACT inhaler Inhale 2 puffs into the lungs every 6 (six) hours as needed for shortness of breath or wheezing.    . promethazine (PHENERGAN) 25 MG tablet Take 25 mg by mouth every 6 (six) hours as needed for nausea/vomiting.    Marland Kitchen HYDROcodone-acetaminophen (NORCO/VICODIN) 5-325 MG per tablet Take 1-2 tablets by mouth every 6 (six) hours as needed for pain. (Patient not taking: Reported on 04/09/2020) 20 tablet 0    REVIEW OF SYSTEMS:   [X]  denotes positive finding, [ ]  denotes negative finding Cardiac  Comments:  Chest pain or chest pressure:    Shortness of breath upon exertion:    Short of breath when lying flat:    Irregular heart rhythm:        Vascular    Pain in calf, thigh, or hip brought on by ambulation:    Pain in feet at night that wakes you up from your sleep:     Blood clot in your veins:    Leg swelling:         Pulmonary    Oxygen at home:    Productive cough:     Wheezing:         Neurologic    Sudden weakness in arms or legs:     Sudden numbness in arms or legs:  x   Sudden onset of difficulty speaking or slurred speech:    Temporary loss of vision in one eye:     Problems with dizziness:         Gastrointestinal    Blood in stool:      Vomited blood:         Genitourinary    Burning when urinating:     Blood in urine:        Psychiatric    Major depression:         Hematologic    Bleeding problems:    Problems with blood clotting too easily:        Skin    Rashes  or ulcers:        Constitutional    Fever or chills:     PHYSICAL EXAM:   Vitals:   04/09/20 1022 04/09/20 1250 04/09/20 1430 04/09/20 1545  BP:  (!) 124/95 130/87 (!) 123/91  Pulse:      Resp:  18 15 18   SpO2:      Weight:  43.4 kg     Height: 5\' 7"  (1.702 m)       GENERAL: The patient is a well-nourished male, in no acute distress. The vital signs are documented above. CARDIAC: There is a regular rate and rhythm.  VASCULAR: I cannot palpate pedal pulses PULMONARY: Nonlabored respirations ABDOMEN: Soft and non-tender with normal pitched bowel sounds.  MUSCULOSKELETAL: There are no major deformities or cyanosis. NEUROLOGIC: Normal motor function to both feet.  He has loss of sensation in the left leg up to the knee SKIN: There are no ulcers or rashes noted. PSYCHIATRIC: The patient has a normal affect.  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 Areas of  occlusion noted in the mid to distal SFA with reconstitution in the distal  SFA. 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 Areas of  occlusion noted in the mid to distal SFA with reconstitution in the distal  SFA. Unable to visualize ATA.   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  A CT scan 1 month ago by report only states that the external iliac is occluded.  I suspect that this is playing a role in his numbness.  Fortunately, with improved blood pressure, his numbness is improving.  Management of his left leg numbness is going to be somewhat challenging because of his ongoing hematochezia.  Any vascular intervention would require the use of IV heparin which  could exacerbate his GI bleed.  I agree with transfusion to see if improving his blood pressure and anemia helps with his symptoms.  Ultimately, he is going to require additional imaging either with a CT angiogram or formal arteriogram.  Normally, I would put him on heparin however that is contraindicated in this situation.  I will continue to monitor his progress with plans for additional imaging in the near future.  The patient should be started on a statin due to his extensive vascular disease.  He will also need carotid Doppler studies.   Leia Alf, MD, FACS [facility] [phone] Pager [phone]

## 2020-04-09 NOTE — H&P (Addendum)
History and Physical    Cy Bresee QRF:[id number] DOB: [birth year] DOA: 04/09/2020  Referring MD/NP/PA: Pattricia Boss, MD PCP: Miguel Aschoff, MD  Patient coming from: Home  Chief Complaint: Left leg numb numbness of the face  I have personally briefly reviewed patient's old medical records in [place]   HPI: Keith Morales is a 74 y.o. male with medical history significant of hypertension and COPD who presented with complaints of left leg numbness and pain.  Over the last couple weeks to months he has been having abdominal pain, decreased appetite, and weight loss of at least 30 pounds.  He has only been able to drink liquids despite family's attempts.  Over the last week or so he has been having dark stools.  He last had 2 bowel movements yesterday.  Denies being on any blood thinners are taking any NSAIDs.  Knee pain on the left leg started acutely today and thereafter he developed numbness for which she was unable to get up and ambulate.  He reports that he last had a colonoscopy with Dr.[facility] few years ago was noted to have a couple polyps which were removed.  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  He quit smoking cigarettes over 20 years ago, but intermittently smokes Black & Mild every now and then.  ED Course: Patient was seen as a code stroke initially upon arrival into the emergency department due to his complaints.  CT angiogram of the head and neck was negative for any acute abnormalities.  Vital signs were noted to be stable.  After further evaluation symptoms were thought more likely vascular in nature and code stroke was canceled.  Labs significant for hemoglobin 7 (previously 12.7  g/dL on 8/1) and sodium 130.  Stool guaiacs were noted to be positive.  Dr. Penelope Coop of GI was formally consulted.  The case was discussed with Dr. Trula Slade of vascular surgery who ordered ABIs and duplex studies.  [facility] called to admit.  Review of Systems  Constitutional: Positive for malaise/fatigue and weight loss. Negative for fever.  HENT: Negative for congestion and ear discharge.   Eyes: Negative for photophobia and pain.  Respiratory: Negative for cough and shortness of breath.   Cardiovascular: Positive for claudication. Negative for chest pain and leg swelling.  Gastrointestinal: Positive for abdominal pain and blood in stool. Negative for constipation, nausea and vomiting.  Genitourinary: Negative for dysuria and hematuria.  Musculoskeletal: Positive for joint pain and myalgias.  Skin: Negative for rash.  Neurological: Positive for sensory change and focal weakness. Negative for loss of consciousness.  Psychiatric/Behavioral: Negative for substance abuse. The patient is not nervous/anxious.     Past Medical History:  Diagnosis Date  . COPD (chronic obstructive pulmonary disease) ([place])   . Hypertension     History reviewed. No pertinent surgical history.   reports that he has been smoking cigarettes and cigars. He has never used smokeless tobacco. He reports that he does not drink alcohol and does not use drugs.  No Known Allergies  History reviewed. No pertinent family history.  Prior to Admission medications   Medication Sig Start Date End Date Taking? Authorizing Provider  ferrous sulfate 325 (65 FE) MG tablet Take 1 tablet by mouth  daily. 12/23/19  Yes [provider]  PROAIR HFA 108 (90 Base) MCG/ACT inhaler Inhale 2 puffs into the lungs every 6 (six) hours as needed for shortness of breath or wheezing. 03/11/20  Yes [provider]  promethazine (PHENERGAN) 25 MG tablet Take 25 mg by mouth every 6 (six) hours as needed for nausea/vomiting. 03/17/20  Yes  [provider]  HYDROcodone-acetaminophen (NORCO/VICODIN) 5-325 MG per tablet Take 1-2 tablets by mouth every 6 (six) hours as needed for pain. Patient not taking: Reported on 04/09/2020 12/02/12   Hyman Bible, PA-C    Physical Exam:  Constitutional: Cachectic male currently in no acute distress. Vitals:   04/09/20 0915 04/09/20 1017 04/09/20 1022 04/09/20 1250  BP: 103/81   (!) 124/95  Pulse: 96     Resp: 14   18  SpO2:  98%    Weight:   43.4 kg   Height:   5\' 7"  (1.702 m)    Eyes: PERRL, lids and conjunctivae normal ENMT: Mucous membranes are dry. Posterior pharynx clear of any exudate or lesions.  Neck: normal, supple, no masses, no thyromegaly Respiratory: clear to auscultation bilaterally, no wheezing, no crackles. Normal respiratory effort. No accessory muscle use.  Cardiovascular: Regular rate and rhythm, no murmurs / rubs / gallops. No extremity edema. 2+ pedal pulses. No carotid bruits.  Abdomen: no tenderness, no masses palpated. No hepatosplenomegaly. Bowel sounds positive.  Musculoskeletal: no clubbing / cyanosis. No joint deformity upper and lower extremities. Good ROM, no contractures. Normal muscle tone.  Skin: no rashes, lesions, ulcers. No induration Neurologic: CN 2-12 grossly intact. Sensation intact, DTR normal. Strength 5/5 in all 4.  Psychiatric: Normal judgment and insight. Alert and oriented x 3. Normal mood.     Labs on Admission: I have personally reviewed following labs and imaging studies  CBC: Recent Labs  Lab 04/03/20 2321 04/09/20 0915 04/09/20 0917  WBC 7.0 8.8  --   NEUTROABS  --  6.3  --   HGB 12.7* 7.0* 7.1*  HCT 39.3 20.8* 21.0*  MCV 91.4 90.0  --   PLT 332 238  --    Basic Metabolic Panel: Recent Labs  Lab 04/03/20 2321 04/09/20 0915 04/09/20 0917  NA 133* 130* 134*  K 4.1 3.5 3.6  CL 98 97* 96*  CO2 24 20*  --   GLUCOSE 119* 167* 167*  BUN 13 34* 35*  CREATININE 1.12 1.08 1.00  CALCIUM 9.2 8.4*  --     GFR: Estimated Creatinine Clearance: 40.4 mL/min (by C-G formula based on SCr of 1 mg/dL). Liver Function Tests: Recent Labs  Lab 04/09/20 0915  AST 15  ALT 10  ALKPHOS 44  BILITOT 0.7  PROT 5.2*  ALBUMIN 2.8*   No results for input(s): LIPASE, AMYLASE in the last 168 hours. No results for input(s): AMMONIA in the last 168 hours. Coagulation Profile: Recent Labs  Lab 04/09/20 0915  INR 1.1   Cardiac Enzymes: No results for input(s): CKTOTAL, CKMB, CKMBINDEX, TROPONINI in the last 168 hours. BNP (last 3 results) No results for input(s): PROBNP in the last 8760 hours. HbA1C: No results for input(s): HGBA1C in the last 72 hours. CBG: Recent Labs  Lab 04/09/20 0912  GLUCAP 169*   Lipid Profile: No results for input(s): CHOL, HDL, LDLCALC, TRIG, CHOLHDL, LDLDIRECT in the last 72 hours. Thyroid Function Tests: No results for input(s): TSH, T4TOTAL, FREET4, T3FREE, THYROIDAB in the last 72 hours. Anemia Panel: No results for input(s): VITAMINB12, FOLATE, FERRITIN, TIBC,  IRON, RETICCTPCT in the last 72 hours. Urine analysis:    Component Value Date/Time   COLORURINE ORANGE BIOCHEMICALS MAY BE AFFECTED BY COLOR (A) 08/11/2008 1223   APPEARANCEUR CLEAR 08/11/2008 1223   LABSPEC 1.036 (H) 08/11/2008 1223   PHURINE 6.0 08/11/2008 1223   GLUCOSEU NEGATIVE 08/11/2008 1223   HGBUR TRACE (A) 08/11/2008 1223   BILIRUBINUR SMALL (A) 08/11/2008 1223   KETONESUR 15 (A) 08/11/2008 1223   PROTEINUR >300 (A) 08/11/2008 1223   UROBILINOGEN 1.0 08/11/2008 1223   NITRITE NEGATIVE 08/11/2008 1223   LEUKOCYTESUR NEGATIVE 08/11/2008 1223   Sepsis Labs: No results found for this or any previous visit (from the past 240 hour(s)).   Radiological Exams on Admission: CT Code Stroke CTA Head W/WO contrast  Result Date: 04/09/2020 CLINICAL DATA:  Focal neuro deficit, stroke suspected. Right-sided weakness. EXAM: CT ANGIOGRAPHY HEAD AND NECK TECHNIQUE: Multidetector CT imaging of the head  and neck was performed using the standard protocol during bolus administration of intravenous contrast. Multiplanar CT image reconstructions and MIPs were obtained to evaluate the vascular anatomy. Carotid stenosis measurements (when applicable) are obtained utilizing NASCET criteria, using the distal internal carotid diameter as the denominator. CONTRAST:  14mL OMNIPAQUE IOHEXOL 350 MG/ML SOLN COMPARISON:  No pertinent prior exams are available for comparison. FINDINGS: CTA NECK FINDINGS Aortic arch: Standard aortic branching. No hemodynamically significant innominate or proximal subclavian artery stenosis. Right carotid system: CCA and ICA patent within the neck without stenosis Left carotid system: CCA and ICA patent within the neck without stenosis. Vertebral arteries: Codominant and patent within the neck without stenosis Skeleton: No acute bony abnormality identified. Cervical spondylosis. Other neck: No neck mass or cervical lymphadenopathy. Upper chest: No consolidation within the imaged lung apices. Pulmonary emphysema Review of the MIP images confirms the above findings CTA HEAD FINDINGS Anterior circulation: The intracranial internal carotid arteries are patent. Minimal nonstenotic plaque within both vessels. The M1 middle cerebral arteries are patent without significant stenosis. No M2 proximal branch occlusion or high-grade proximal stenosis is identified. The anterior cerebral arteries are patent. No intracranial aneurysm is identified. Posterior circulation: The intracranial vertebral arteries are patent. The basilar artery is patent. The posterior cerebral arteries are patent. A left posterior communicating artery is present. The right posterior communicating artery is hypoplastic or absent. Venous sinuses: Within limitations of contrast timing, no convincing thrombus. Anatomic variants: As described Review of the MIP images confirms the above findings No emergent large vessel occlusion. These  results were communicated to Dr. Leonel Ramsay At 9:45 amon 8/7/2021by text page via the [facility] messaging system. IMPRESSION: CTA neck: The common carotid, internal carotid and vertebral arteries are patent within the neck without hemodynamically significant stenosis. CTA head: 1. No intracranial large vessel occlusion or proximal high-grade arterial stenosis. 2. Mild non-stenotic plaque within both intracranial internal carotid arteries. Electronically Signed   By: Kellie Simmering DO   On: 04/09/2020 09:45   CT Code Stroke CTA Neck W/WO contrast  Result Date: 04/09/2020 CLINICAL DATA:  Focal neuro deficit, stroke suspected. Right-sided weakness. EXAM: CT ANGIOGRAPHY HEAD AND NECK TECHNIQUE: Multidetector CT imaging of the head and neck was performed using the standard protocol during bolus administration of intravenous contrast. Multiplanar CT image reconstructions and MIPs were obtained to evaluate the vascular anatomy. Carotid stenosis measurements (when applicable) are obtained utilizing NASCET criteria, using the distal internal carotid diameter as the denominator. CONTRAST:  18mL OMNIPAQUE IOHEXOL 350 MG/ML SOLN COMPARISON:  No pertinent prior exams are available for comparison. FINDINGS:  CTA NECK FINDINGS Aortic arch: Standard aortic branching. No hemodynamically significant innominate or proximal subclavian artery stenosis. Right carotid system: CCA and ICA patent within the neck without stenosis Left carotid system: CCA and ICA patent within the neck without stenosis. Vertebral arteries: Codominant and patent within the neck without stenosis Skeleton: No acute bony abnormality identified. Cervical spondylosis. Other neck: No neck mass or cervical lymphadenopathy. Upper chest: No consolidation within the imaged lung apices. Pulmonary emphysema Review of the MIP images confirms the above findings CTA HEAD FINDINGS Anterior circulation: The intracranial internal carotid arteries are patent. Minimal nonstenotic  plaque within both vessels. The M1 middle cerebral arteries are patent without significant stenosis. No M2 proximal branch occlusion or high-grade proximal stenosis is identified. The anterior cerebral arteries are patent. No intracranial aneurysm is identified. Posterior circulation: The intracranial vertebral arteries are patent. The basilar artery is patent. The posterior cerebral arteries are patent. A left posterior communicating artery is present. The right posterior communicating artery is hypoplastic or absent. Venous sinuses: Within limitations of contrast timing, no convincing thrombus. Anatomic variants: As described Review of the MIP images confirms the above findings No emergent large vessel occlusion. These results were communicated to Dr. Leonel Ramsay At 9:45 amon 8/7/2021by text page via the [facility] messaging system. IMPRESSION: CTA neck: The common carotid, internal carotid and vertebral arteries are patent within the neck without hemodynamically significant stenosis. CTA head: 1. No intracranial large vessel occlusion or proximal high-grade arterial stenosis. 2. Mild non-stenotic plaque within both intracranial internal carotid arteries. Electronically Signed   By: Kellie Simmering DO   On: 04/09/2020 09:45   DG Chest Port 1 View  Result Date: 04/09/2020 CLINICAL DATA:  Weight loss, smoker EXAM: PORTABLE CHEST 1 VIEW COMPARISON:  04/04/2011 FINDINGS: Hyperinflation. No focal consolidation. No pleural effusion or pneumothorax. The heart is normal in size.  Thoracic aortic atherosclerosis. Calcified mediastinal/hilar lymph nodes. IMPRESSION: No evidence of acute cardiopulmonary disease. Electronically Signed   By: Julian Hy M.D.   On: 04/09/2020 10:45   VAS [place] ABI WITH/WO TBI  Result Date: 04/09/2020 LOWER EXTREMITY DOPPLER STUDY Indications: Rest pain, numbness, peripheral artery disease, and Known left              iliac occlusion. High Risk Factors: Hypertension.  Comparison Study: No prior  study on file Performing Technologist: Sharion Dove RVS  Examination Guidelines: A complete evaluation includes at minimum, Doppler waveform signals and systolic blood pressure reading at the level of bilateral brachial, anterior tibial, and posterior tibial arteries, when vessel segments are accessible. Bilateral testing is considered an integral part of a complete examination. Photoelectric Plethysmograph (PPG) waveforms and toe systolic pressure readings are included as required and additional duplex testing as needed. Limited examinations for reoccurring indications may be performed as noted.  ABI Findings: +---------+------------------+-----+---------+--------+ Right    Rt Pressure (mmHg)IndexWaveform Comment  +---------+------------------+-----+---------+--------+ Brachial 125                    triphasic         +---------+------------------+-----+---------+--------+ PTA                             absent            +---------+------------------+-----+---------+--------+ DP                              absent            +---------+------------------+-----+---------+--------+  Great Toe                       Absent            +---------+------------------+-----+---------+--------+ +---------+------------------+-----+--------+-------+ Left     Lt Pressure (mmHg)IndexWaveformComment +---------+------------------+-----+--------+-------+ PTA                             absent          +---------+------------------+-----+--------+-------+ DP                              absent          +---------+------------------+-----+--------+-------+ Great Toe                       Absent          +---------+------------------+-----+--------+-------+ +-------+-----------+-----------+------------+------------+ ABI/TBIToday's ABIToday's TBIPrevious ABIPrevious TBI +-------+-----------+-----------+------------+------------+ Right  absent     absent                               +-------+-----------+-----------+------------+------------+ Left   absent     absent                              +-------+-----------+-----------+------------+------------+  Summary: Right: Resting right ankle-brachial index indicates critical limb ischemia. Absent waveforms in the DP, PT and great toe. Left: Resting left ankle-brachial index indicates critical left limb ischemia. Absent waveforms in the DP, PT and great toe.  *See table(s) above for measurements and observations.    Preliminary    CT HEAD CODE STROKE WO CONTRAST  Result Date: 04/09/2020 CLINICAL DATA:  Code stroke. Provided history: Right-sided weakness. EXAM: CT HEAD WITHOUT CONTRAST TECHNIQUE: Contiguous axial images were obtained from the base of the skull through the vertex without intravenous contrast. COMPARISON:  No pertinent prior exams are available for comparison. FINDINGS: Brain: Moderate generalized parenchymal atrophy. Moderate patchy T2/FLAIR hyperintensity within the cerebral white matter is nonspecific, but consistent with chronic small vessel ischemic disease. There is no acute intracranial hemorrhage. No demarcated cortical infarct is identified. No extra-axial fluid collection. No evidence of intracranial mass. No midline shift. Vascular: No hyperdense vessel.  Atherosclerotic calcifications. Skull: Normal. Negative for fracture or focal lesion. Sinuses/Orbits: Visualized orbits show no acute finding. Complete opacification of the left maxillary sinus with associated chronic reactive osteitis. Extensive partial opacification of the left frontal sinus with associated frothy secretions. No significant mastoid effusion. ASPECTS ([place] Stroke Program Early CT Score) - Ganglionic level infarction (caudate, lentiform nuclei, internal capsule, insula, M1-M3 cortex): 7 - Supraganglionic infarction (M4-M6 cortex): 3 Total score (0-10 with 10 being normal): 10 These results were communicated to Dr. Leonel Ramsay At  9:26 amon 8/7/2021by text page via the [facility] messaging system. IMPRESSION: No CT evidence of acute intracranial abnormality. Moderate generalized parenchymal atrophy and chronic small vessel ischemic disease. Left frontal and maxillary sinusitis as described. Electronically Signed   By: Kellie Simmering DO   On: 04/09/2020 09:26   VAS [place] LOWER EXTREMITY ARTERIAL DUPLEX  Result Date: 04/09/2020 LOWER EXTREMITY ARTERIAL DUPLEX STUDY Indications: Rest pain and numbness, peripheral artery disease, and known left              iliac occlusion. High Risk Factors: Hypertension.  Current ABI: absent pulses bilaterally Comparison Study:  No prior study on file Performing Technologist: Sharion Dove RVS  Examination Guidelines: A complete evaluation includes B-mode imaging, spectral Doppler, color Doppler, and power Doppler as needed of all accessible portions of each vessel. Bilateral testing is considered an integral part of a complete examination. Limited examinations for reoccurring indications may be performed as noted.  +----------+--------+-----+--------+----------+--------------------------------+ LEFT      PSV cm/sRatioStenosisWaveform  Comments                         +----------+--------+-----+--------+----------+--------------------------------+ CFA Prox               occludedabsent                                     +----------+--------+-----+--------+----------+--------------------------------+ CFA Mid                occludedabsent                                     +----------+--------+-----+--------+----------+--------------------------------+ CFA Distal             occludedabsent                                     +----------+--------+-----+--------+----------+--------------------------------+ DFA       45                                                              +----------+--------+-----+--------+----------+--------------------------------+ SFA Prox  47                    monophasic                                 +----------+--------+-----+--------+----------+--------------------------------+ SFA Mid   31                   monophasicsome areas of occlusion noted                                             mid to distal                    +----------+--------+-----+--------+----------+--------------------------------+ SFA Distal11                   monophasicreconstituted flow               +----------+--------+-----+--------+----------+--------------------------------+ POP Prox  7                    monophasic                                 +----------+--------+-----+--------+----------+--------------------------------+ POP Mid                        monophasic                                 +----------+--------+-----+--------+----------+--------------------------------+  POP Distal10                                                              +----------+--------+-----+--------+----------+--------------------------------+ ATA Prox                                 not visualized                   +----------+--------+-----+--------+----------+--------------------------------+ ATA Mid                                  not visualized                   +----------+--------+-----+--------+----------+--------------------------------+ ATA Distal                               not visualized                   +----------+--------+-----+--------+----------+--------------------------------+ PTA Prox  13                   monophasic                                 +----------+--------+-----+--------+----------+--------------------------------+ PTA Mid   10                   monophasic                                 +----------+--------+-----+--------+----------+--------------------------------+ PTA Distal7                    monophasic                                  +----------+--------+-----+--------+----------+--------------------------------+  Summary: Left: 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 Areas of occlusion noted in the mid to distal SFA with reconstitution in the distal SFA. Unable to visualize ATA.  See table(s) above for measurements and observations.    Preliminary     EKG: Independently reviewed.  Sinus rhythm at 91 bpm  Assessment/Plan GI bleed with acute blood loss anemia: Patient found to have a hemoglobin of 7 g/dL with positive stool guaiacs.  Hemoglobin previously noted to be 12.7 on 8/1.  He reports prior history of polyps.  Patient was typed and screened and ordered be transfused 2 units of packed red blood cells.  Dr. Penelope Coop of GI was formally consulted.  -Admit to a medical telemetry bed -Clear liquid diet as tolerated -Continue with transfusion of 2 units of red blood cells -Protonix IV 40 mg IV twice daily -Appreciate Dr. Ernestene Kiel GI, will follow-up for further recommendations  Peripheral vascular disease: Patient complained of left leg pain acute numbness.  Thought less likely to be secondary to stroke and more like disease.  Noted during CT scan to have chronic occlusion of the left common femoral artery with reconstitution of flow on  previous CT.  Case had been discussed with Dr. Trula Slade vascular surgery who recommended checking ABIs and vascular duplex.  -Appreciate Dr. Trula Slade with vascular surgery, we will follow-up for further recommendations  Hyponatremia: Acute.  Sodium 130 on admission.  Suspect likely secondary to dehydration given elevated BUN to creatinine ratio. -Gentle IV fluids at 75 mL/h  -Continue to monitor sodium level  Weight loss/underweight: BMI 14.99 kg/m.  Patient reports weight loss of over 30 pounds over the last month or so.  Last hemoglobin A1c 5 and TSH 2.408 from lab work 03/17/2020 on care everywhere.  Tobacco use: Patient stated he no longer smokes cigarettes, but does smoke  Black & Mild every now and then. DVT prophylaxis:SCDs Code Status: Full Family Communication: Updated significant other over the phone at the patient's request Disposition Plan: Likely discharge home once medically stable. Consults called: Vascular surgery Admission status: Inpatient   Norval Morton MD [facility] Pager [phone]   If 7PM-7AM, please contact night-coverage [email or web address] Password [email or web address]  04/09/2020, 1:17 PM

## 2020-04-09 NOTE — Consult Note (Signed)
Subjective:   HPI  The patient is a 74 year old male with a medical history of hypertension and COPD who presented to the hospital with left leg numbness and was found to have vascular occlusion.  He was seen by vascular surgery.  The reason GI was consulted was because the patient was found to be anemic with a hemoglobin of 7.  The patient states that he has been having black stools for several weeks.  He denies hematemesis or bright red rectal bleeding.  He has also been losing weight and thinks he has lost about 30 pounds over a short period of time.  He states that his doctor is in [place] and it was recommended to him to get a Cologuard test recently but he never did.  He also tells me that years ago he had a colonoscopy by Dr. Collene Mares and some polyps were found, and he was told to have a follow-up but never did come back for that.  He had a CT scan of the abdomen and pelvis recently that I do not see an official report on that.  He states his doctor in [place] ordered that but he never got any results on it.    Past Medical History:  Diagnosis Date  . COPD (chronic obstructive pulmonary disease) ([place])   . Hypertension    History reviewed. No pertinent surgical history. Social History   Socioeconomic History  . Marital status: Divorced    Spouse name: Not on file  . Number of children: Not on file  . Years of education: Not on file  . Highest education level: Not on file  Occupational History  . Not on file  Tobacco Use  . Smoking status: Current Some Day Smoker    Types: Cigarettes, Cigars  . Smokeless tobacco: Never Used  Substance and Sexual Activity  . Alcohol use: No  . Drug use: No  . Sexual activity: Not on file  Other Topics Concern  . Not on file  Social History Narrative  . Not on file   Social Determinants of Health   Financial Resource Strain:   . Difficulty of Paying Living Expenses:   Food Insecurity:   . Worried About Charity fundraiser in the Last  Year:   . Arboriculturist in the Last Year:   Transportation Needs:   . Film/video editor (Medical):   Marland Kitchen Lack of Transportation (Non-Medical):   Physical Activity:   . Days of Exercise per Week:   . Minutes of Exercise per Session:   Stress:   . Feeling of Stress :   Social Connections:   . Frequency of Communication with Friends and Family:   . Frequency of Social Gatherings with Friends and Family:   . Attends Religious Services:   . Active Member of Clubs or Organizations:   . Attends Archivist Meetings:   Marland Kitchen Marital Status:   Intimate Partner Violence:   . Fear of Current or Ex-Partner:   . Emotionally Abused:   Marland Kitchen Physically Abused:   . Sexually Abused:    family history is not on file.  Current Facility-Administered Medications:  .  0.9 %  sodium chloride infusion, 10 mL/hr, Intravenous, Once, Ray, Danielle, MD .  0.9 %  sodium chloride infusion, , Intravenous, Continuous, Smith, Rondell A, MD, Last Rate: 75 mL/hr at 04/09/20 1745, New Bag at 04/09/20 1745 .  acetaminophen (TYLENOL) tablet 650 mg, 650 mg, Oral, Q6H PRN **OR** acetaminophen (TYLENOL) suppository 650 mg,  650 mg, Rectal, Q6H PRN, Smith, Rondell A, MD .  albuterol (PROVENTIL) (2.5 MG/3ML) 0.083% nebulizer solution 2.5 mg, 2.5 mg, Nebulization, Q6H PRN, Tamala Julian, Rondell A, MD .  ferrous sulfate tablet 325 mg, 325 mg, Oral, Daily, Smith, Rondell A, MD .  morphine 2 MG/ML injection 2 mg, 2 mg, Intravenous, Q3H PRN, Smith, Rondell A, MD .  ondansetron (ZOFRAN) tablet 4 mg, 4 mg, Oral, Q6H PRN **OR** ondansetron (ZOFRAN) injection 4 mg, 4 mg, Intravenous, Q6H PRN, Smith, Rondell A, MD .  pantoprazole (PROTONIX) injection 40 mg, 40 mg, Intravenous, Q12H, Smith, Rondell A, MD .  sodium chloride flush (NS) 0.9 % injection 3 mL, 3 mL, Intravenous, Once, Ray, Andee Poles, MD .  sodium chloride flush (NS) 0.9 % injection 3 mL, 3 mL, Intravenous, Q12H, Smith, Rondell A, MD  Current Outpatient Medications:  .   ferrous sulfate 325 (65 FE) MG tablet, Take 1 tablet by mouth daily., Disp: , Rfl:  .  PROAIR HFA 108 (90 Base) MCG/ACT inhaler, Inhale 2 puffs into the lungs every 6 (six) hours as needed for shortness of breath or wheezing., Disp: , Rfl:  .  promethazine (PHENERGAN) 25 MG tablet, Take 25 mg by mouth every 6 (six) hours as needed for nausea/vomiting., Disp: , Rfl:  .  HYDROcodone-acetaminophen (NORCO/VICODIN) 5-325 MG per tablet, Take 1-2 tablets by mouth every 6 (six) hours as needed for pain. (Patient not taking: Reported on 04/09/2020), Disp: 20 tablet, Rfl: 0 No Known Allergies   Objective:     BP (!) 123/91   Pulse 96   Resp 18   Ht 5\' 7"  (1.702 m)   Wt 43.4 kg   SpO2 98%   BMI 14.99 kg/m   No acute distress  Nonicteric  Heart regular rhythm no murmurs  Lungs clear  Abdomen: Nontender and no obvious masses felt  Laboratory No components found for: D1    Assessment:     Anemia  Melena over the last few weeks  Weight loss  History of colon polyps  Vascular occlusion      Plan:     Given the weight loss, melena, history of colon polyps, and anemia, I think he needs upper endoscopy and colonoscopy.  We will keep on clear liquids today and over the weekend prep tomorrow with plans for endoscopic procedures Monday.  Transfuse blood as needed.

## 2020-04-09 NOTE — ED Notes (Signed)
Called CL to cancel code stroke per dr. Leonel Ramsay

## 2020-04-09 NOTE — Consult Note (Signed)
Neurology Consultation Reason for Consult: Left leg numbness Referring Physician: Jeanell Sparrow, D  CC: Left leg numbness  History is obtained from: Patient  HPI: Keith Morales is a 74 y.o. male who has been having abdominal pain, generalized weakness for a couple of weeks but this morning started having acute left leg pain and numbness started abruptly around 8 AM.  Due to the symptoms nine one was called and due to the focal nature of the numbness, code stroke was activated.   LKW: 8 AM tpa given?: no, mild symptoms    ROS: A 14 point ROS was performed and is negative except as noted in the HPI.   Past Medical History:  Diagnosis Date  . Hypertension      Fhx:  No hx similar  Social History:  reports that he has never smoked. He does not have any smokeless tobacco history on file. He reports that he does not drink alcohol and does not use drugs.   Exam: Current vital signs: There were no vitals taken for this visit. Vital signs in last 24 hours:     Physical Exam  Constitutional: Appears well-developed and well-nourished.  Psych: Affect appropriate to situation Eyes: No scleral injection HENT: No OP obstrucion MSK: no joint deformities.  Cardiovascular: Normal rate and regular rhythm.  Respiratory: Effort normal, non-labored breathing GI: Soft.  No distension. There is no tenderness.  Skin: WDI  Neuro: Mental Status: Patient is awake, alert, oriented to person, place, month, year, and situation. Patient is able to give a clear and coherent history. No signs of aphasia or neglect Cranial Nerves: II: Visual Fields are full. Pupils are equal, round, and reactive to light.   III,IV, VI: EOMI without ptosis or diploplia.  V: Facial sensation is symmetric to temperature VII: Facial movement is symmetric.  VIII: hearing is intact to voice X: Uvula elevates symmetrically XI: Shoulder shrug is symmetric. XII: tongue is midline without atrophy or fasciculations.   Motor: Tone is normal. Bulk is normal. 5/5 strength was present in bilateral arms and right leg, he has mild give way in his left leg Sensory: Sensation is mildly diminished in his left leg, intact in his arm and both right arm and leg.  He has no extinction. Cerebellar: FNF intact bilaterally, he has some difficulty performing heel-knee-shin due to not clearly understand command, but no definite ataxia.      I have reviewed labs in epic and the results pertinent to this consultation are: CBG 169 Creatinine 1.0  I have reviewed the images obtained: CT head-no acute findings  Impression: 74 year old male with acute left leg pain/numbness.  I do think a CTA to rule out right ACA pathology would be prudent, but with the pain, I not certain there is actually a neuro logical process going on.  If his CTA is negative, would consider MRI to rule out stroke but this is negative then no further work-up from a neurological perspective.  Recommendations: 1) CTA head neck 2) MRI brain 3) if the above are negative, no further recommendations.   Roland Rack, MD [facility] [phone]  If 7pm- 7am, please page neurology on call as listed in [place].

## 2020-04-09 NOTE — ED Notes (Signed)
Pt is sinus tach on monitor 

## 2020-04-09 NOTE — Progress Notes (Signed)
VASCULAR LAB    ABIs and left lower extremity arterial duplex completed.    Preliminary report:  See CV proc for preliminary results.  Petro Talent, RVT 04/09/2020, 12:06 PM

## 2020-04-09 NOTE — Progress Notes (Signed)
With pain being significant ccomplaint and significant vascular disease of the left foot, I think peripheral vascular etiology is much more likely than cerebravascular disease. No need for MRI at this time, please call with any further questions or concerns.    Roland Rack, MD [facility] [phone]  If 7pm- 7am, please page neurology on call as listed in [place].

## 2020-04-09 NOTE — ED Provider Notes (Signed)
[facility] EMERGENCY DEPARTMENT Provider Note   CSN: [id number] Arrival date & time: 04/09/20  1829     History No chief complaint on file.   Kia Stavros is a 74 y.o. male.  HPI    74 yo male presents as code stroke.  Patient states poor po intake and not feeling well for several weeks.  One hour pte patient with increased pain and numbness of lle.  EMS reports prehospital bs 217.  SBP 110. Patient endorses 30-40 lb weight loss Dark stools Ho smoking- quit 20 years ago but started cigar one month ago CT ordered by pmd done 03/21/2020 reviewed- no definite acute cause of weight loss.  Stool ball  noted, chronic occlusion of left external iliac noted  Past Medical History:  Diagnosis Date  . Hypertension     There are no problems to display for this patient.   No past surgical history on file.     No family history on file.  Social History   Tobacco Use  . Smoking status: Current Some Day Smoker    Types: Cigarettes, Cigars  . Smokeless tobacco: Never Used  Substance Use Topics  . Alcohol use: No  . Drug use: No    Home Medications Prior to Admission medications   Medication Sig Start Date End Date Taking? Authorizing Provider  amLODipine-benazepril (LOTREL) 10-40 MG per capsule Take 1 capsule by mouth daily.    [provider]  HYDROcodone-acetaminophen (NORCO/VICODIN) 5-325 MG per tablet Take 1-2 tablets by mouth every 6 (six) hours as needed for pain. 12/02/12   Hyman Bible, PA-C  ibuprofen (ADVIL,MOTRIN) 800 MG tablet Take 800 mg by mouth every 8 (eight) hours as needed for pain.    [provider]    Allergies    Patient has no known allergies.  Review of Systems   Review of Systems  Constitutional: Positive for activity change, appetite change, fatigue and unexpected weight change.  Gastrointestinal:       Dark stool  Genitourinary: Negative.   Musculoskeletal: Negative.   Skin: Negative.   Neurological:  Positive for light-headedness.  Hematological: Negative.   Psychiatric/Behavioral: Negative.   All other systems reviewed and are negative.   Physical Exam Updated Vital Signs There were no vitals taken for this visit.  Physical Exam Vitals and nursing note reviewed.  Constitutional:      Appearance: Normal appearance.  HENT:     Head: Normocephalic and atraumatic.     Right Ear: External ear normal.     Left Ear: External ear normal.     Nose: Nose normal.     Mouth/Throat:     Mouth: Mucous membranes are moist.     Pharynx: Oropharynx is clear.  Eyes:     Extraocular Movements: Extraocular movements intact.     Pupils: Pupils are equal, round, and reactive to light.  Cardiovascular:     Rate and Rhythm: Normal rate and regular rhythm.     Comments: Right femoral pulse 1+ Left femoral pulse not obtained palpably or by doppler Right and left dp and pt not obtained palpably or by doppler Pulmonary:     Effort: Pulmonary effort is normal.     Breath sounds: Normal breath sounds.  Abdominal:     General: Abdomen is flat.     Palpations: Abdomen is soft.  Musculoskeletal:        General: No signs of injury. Normal range of motion.     Cervical back: Normal range  of motion.     Comments: Pulses not palpable either foot, dp or post tib, but both feet warm   Skin:    General: Skin is warm.     Capillary Refill: Capillary refill takes less than 2 seconds.  Neurological:     General: No focal deficit present.     Mental Status: He is alert and oriented to person, place, and time.  Psychiatric:        Mood and Affect: Mood normal.     ED Results / Procedures / Treatments   Labs (all labs ordered are listed, but only abnormal results are displayed) Labs Reviewed  I-STAT CHEM 8, ED - Abnormal; Notable for the following components:      Result Value   Sodium 134 (*)    Chloride 96 (*)    BUN 35 (*)    Glucose, Bld 167 (*)    Hemoglobin 7.1 (*)    HCT 21.0 (*)    All  other components within normal limits  CBG MONITORING, ED - Abnormal; Notable for the following components:   Glucose-Capillary 169 (*)    All other components within normal limits  PROTIME-INR  APTT  CBC  DIFFERENTIAL  COMPREHENSIVE METABOLIC PANEL    EKG EKG Interpretation  Date/Time:  Saturday April 09 2020 10:09:32 EDT Ventricular Rate:  91 PR Interval:    QRS Duration: 70 QT Interval:  396 QTC Calculation: 488 R Axis:   89 Text Interpretation: Sinus rhythm Borderline short PR interval Right atrial enlargement Anteroseptal infarct, age indeterminate Confirmed by Pattricia Boss [phone]) on 04/09/2020 10:12:12 AM   Radiology CT HEAD CODE STROKE WO CONTRAST  Result Date: 04/09/2020 CLINICAL DATA:  Code stroke. Provided history: Right-sided weakness. EXAM: CT HEAD WITHOUT CONTRAST TECHNIQUE: Contiguous axial images were obtained from the base of the skull through the vertex without intravenous contrast. COMPARISON:  No pertinent prior exams are available for comparison. FINDINGS: Brain: Moderate generalized parenchymal atrophy. Moderate patchy T2/FLAIR hyperintensity within the cerebral white matter is nonspecific, but consistent with chronic small vessel ischemic disease. There is no acute intracranial hemorrhage. No demarcated cortical infarct is identified. No extra-axial fluid collection. No evidence of intracranial mass. No midline shift. Vascular: No hyperdense vessel.  Atherosclerotic calcifications. Skull: Normal. Negative for fracture or focal lesion. Sinuses/Orbits: Visualized orbits show no acute finding. Complete opacification of the left maxillary sinus with associated chronic reactive osteitis. Extensive partial opacification of the left frontal sinus with associated frothy secretions. No significant mastoid effusion. ASPECTS ([place] Stroke Program Early CT Score) - Ganglionic level infarction (caudate, lentiform nuclei, internal capsule, insula, M1-M3 cortex): 7 -  Supraganglionic infarction (M4-M6 cortex): 3 Total score (0-10 with 10 being normal): 10 These results were communicated to Dr. Leonel Ramsay At 9:26 amon 8/7/2021by text page via the [facility] messaging system. IMPRESSION: No CT evidence of acute intracranial abnormality. Moderate generalized parenchymal atrophy and chronic small vessel ischemic disease. Left frontal and maxillary sinusitis as described. Electronically Signed   By: Kellie Simmering DO   On: 04/09/2020 09:26    Procedures .Critical Care Performed by: Pattricia Boss, MD Authorized by: Pattricia Boss, MD   Critical care provider statement:    Critical care time (minutes):  65   Critical care end time:  04/09/2020 11:10 AM   Critical care was necessary to treat or prevent imminent or life-threatening deterioration of the following conditions:  Circulatory failure   Critical care was time spent personally by me on the following activities:  Discussions with  consultants, evaluation of patient's response to treatment, examination of patient, ordering and performing treatments and interventions, ordering and review of laboratory studies, ordering and review of radiographic studies, pulse oximetry, re-evaluation of patient's condition, obtaining history from patient or surrogate and review of old charts   (including critical care time)  Medications Ordered in ED Medications  sodium chloride flush (NS) 0.9 % injection 3 mL (has no administration in time range)    ED Course  I have reviewed the triage vital signs and the nursing notes.  Pertinent labs & imaging results that were available during my care of the patient were reviewed by me and considered in my medical decision making (see chart for details).  Clinical Course as of Apr 09 1058  Sat Apr 09, 2020  1048 Labs reviewed- new anemia, hyperlgycemia noted Review of ct head, cta and cxr imaging and reports    [DR]    Clinical Course User Index [DR] Pattricia Boss, MD   MDM  Rules/Calculators/A&P                          1-weakness- generalized with new left foot paresthesia 2- weight loss 3- rectal bleeding with anemia-patient last seen by Dr. Collene Mares 2015- patient does not recall any further gi evaluation-  Discussed with Dr Penelope Coop who will see in consult 4- left foot pain with known chronic occlusion of left femoral artery.  Foot cool without change in tactile temp from other extemities, sensation intact.  ? Decreased flow due to anemia and chronic occlusion vs acute occlusion.  Will discuss with vascular Discussed with Dr. Trula Slade and he will order duplex and abi.  He will need to be called if any worsening of symptoms especially motor.  Patient currently with some decreased left ankle dorsiflexion only- otherwise motor intact. 5- code stroke- patient presented as code stroke due to lle paresthesias with sudden onset one hour pte.  Seen in conjunction with neurology, please see neuro note for Dr. Cecil Cobbs full evaluation. MRI pending at this time.  11:27 AM Discussed with Dr. Leonel Ramsay and will d/c mri order and code stroke  Discussed with Dr. Tamala Julian and will see for admission   Final Clinical Impression(s) / ED Diagnoses Final diagnoses:  Anemia, unspecified type  Gastrointestinal hemorrhage, unspecified gastrointestinal hemorrhage type  Left leg pain    Rx / DC Orders ED Discharge Orders    None       Pattricia Boss, MD 04/09/20 1208

## 2020-04-10 DIAGNOSIS — E43 Unspecified severe protein-calorie malnutrition: Secondary | ICD-10-CM | POA: Diagnosis present

## 2020-04-10 DIAGNOSIS — I70209 Unspecified atherosclerosis of native arteries of extremities, unspecified extremity: Secondary | ICD-10-CM

## 2020-04-10 LAB — TYPE AND SCREEN
ABO/RH(D): A POS
Antibody Screen: NEGATIVE
Unit division: 0
Unit division: 0

## 2020-04-10 LAB — LIPID PANEL
Cholesterol: 111 mg/dL (ref 0–200)
HDL: 33 mg/dL — ABNORMAL LOW (ref >40)
LDL Cholesterol: 57 mg/dL (ref 0–99)
Total CHOL/HDL Ratio: 3.4 RATIO
Triglycerides: 103 mg/dL (ref <150)
VLDL: 21 mg/dL (ref 0–40)

## 2020-04-10 LAB — CBC
HCT: 29.3 % — ABNORMAL LOW (ref 39.0–52.0)
Hemoglobin: 9.9 g/dL — ABNORMAL LOW (ref 13.0–17.0)
MCH: 28.9 pg (ref 26.0–34.0)
MCHC: 33.8 g/dL (ref 30.0–36.0)
MCV: 85.7 fL (ref 80.0–100.0)
Platelets: 274 10*3/uL (ref 150–400)
RBC: 3.42 MIL/uL — ABNORMAL LOW (ref 4.22–5.81)
RDW: 13.9 % (ref 11.5–15.5)
WBC: 15.4 10*3/uL — ABNORMAL HIGH (ref 4.0–10.5)
nRBC: 0.1 % (ref 0.0–0.2)

## 2020-04-10 LAB — BPAM RBC
Blood Product Expiration Date: 202109012359
Blood Product Expiration Date: 202109022359
ISSUE DATE / TIME: 202108071915
ISSUE DATE / TIME: 202108072139
Unit Type and Rh: 6200
Unit Type and Rh: 6200

## 2020-04-10 LAB — BASIC METABOLIC PANEL
Anion gap: 11 (ref 5–15)
BUN: 23 mg/dL (ref 8–23)
CO2: 22 mmol/L (ref 22–32)
Calcium: 8.7 mg/dL — ABNORMAL LOW (ref 8.9–10.3)
Chloride: 101 mmol/L (ref 98–111)
Creatinine, Ser: 1.07 mg/dL (ref 0.61–1.24)
GFR calc Af Amer: >60 mL/min (ref >60)
GFR calc non Af Amer: >60 mL/min (ref >60)
Glucose, Bld: 134 mg/dL — ABNORMAL HIGH (ref 70–99)
Potassium: 3.6 mmol/L (ref 3.5–5.1)
Sodium: 134 mmol/L — ABNORMAL LOW (ref 135–145)

## 2020-04-10 LAB — HEMOGLOBIN AND HEMATOCRIT, BLOOD
HCT: 28.8 % — ABNORMAL LOW (ref 39.0–52.0)
Hemoglobin: 9.9 g/dL — ABNORMAL LOW (ref 13.0–17.0)

## 2020-04-10 MED ORDER — PEG 3350-KCL-NA BICARB-NACL 420 G PO SOLR
4000.0000 mL | Freq: Once | ORAL | Status: AC
  Administered 2020-04-10: 4000 mL via ORAL
  Filled 2020-04-10 (×2): qty 4000

## 2020-04-10 MED ORDER — ATORVASTATIN CALCIUM 40 MG PO TABS
40.0000 mg | ORAL_TABLET | Freq: Every day | ORAL | Status: DC
  Administered 2020-04-10 – 2020-04-16 (×7): 40 mg via ORAL
  Filled 2020-04-10 (×7): qty 1

## 2020-04-10 NOTE — Progress Notes (Signed)
PROGRESS NOTE    Keith Morales  IRW:[id number] DOB: 02-10-1946 DOA: 04/09/2020 PCP: Miguel Aschoff, MD    Brief Narrative:  Keith Morales is a 74 y.o. male with medical history significant of hypertension and COPD who presented with complaints of left leg numbness and pain.  Over the last couple weeks to months he has been having abdominal pain, decreased appetite, and weight loss of at least 30 pounds.  He has only been able to drink liquids despite family's attempts.  Over the last week or so he has been having dark stools.  He last had 2 bowel movements yesterday.  Denies being on any blood thinners are taking any NSAIDs.  Knee pain on the left leg started acutely today and thereafter he developed numbness for which she was unable to get up and ambulate.  He reports that he last had a colonoscopy with Dr.[facility] few years ago was noted to have a couple polyps which were removed. 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  He quit smoking cigarettes over 20 years ago, but intermittently smokes Black & Mild every now and then.  Patient was seen as a code stroke initially upon arrival into the emergency department due to his complaints.  CT angiogram of the head and neck was negative for any acute abnormalities.  Vital signs were noted to be stable.  After further evaluation symptoms were thought more likely vascular in nature and code stroke was canceled.  Labs significant for hemoglobin 7 (previously 12.7 g/dL on 8/1) and sodium 130.  Stool guaiacs were noted to be positive.  Dr. Penelope Coop of GI was formally consulted.  The case was discussed with Dr. Trula Slade of vascular surgery who ordered ABIs and duplex studies.   [facility] called to admit.   Assessment & Plan:   Active Problems:   PVD (peripheral vascular disease) (HCC)   GI bleed   Hyponatremia   Weight loss   GI bleed with acute blood loss anemia:  Patient found to have a hemoglobin of 7 g/dL with positive stool guaiacs.  Hemoglobin previously noted to be 12.7 on 8/1.  He reports prior history of polyps.   --[facility] GI following; appreciate assistance --s/p 2u pRBC on 8/7 --Hgb 7.1>9.9 --continue to monitor H/H q8h --protonix 40mg  IV q12h --Clear liquid diet, n.p.o. after midnight --Plan EGD/colonoscopy on 04/11/2020.  Peripheral vascular disease:  Patient complained of left leg pain acute numbness.  Thought less likely to be secondary to stroke and more like disease.  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   ABI with critical limb ischemia.  Ultrasound duplex of vascular left lower extremity with total occlusion left common femoral artery and areas of occlusion mid to distal SFA.   --Vascular surgery following, Dr. Trula Slade, appreciate assistance --Believes paresthesias to lower extremities due to a low flow state given his significant anemia and hypertension and currently delaying further vascular work-up until anemia is stemming from GI source have resolved as he will require IV heparin for an angiogram. --Started atorvastatin 40 mg p.o. daily --Lipid panel: Pending  Hyponatremia: Acute.   Sodium 130 on admission.  Suspect likely secondary to dehydration  given elevated BUN to creatinine ratio. --Gentle IV fluids at 75 mL/h  --Na 130>134 --Continue to monitor sodium level  Weight loss/underweight:  Severe protein calorie malnutrition BMI 14.99 kg/m.  Patient reports weight loss of over 30 pounds over the last month or so.  Last hemoglobin A1c 5 and TSH 2.408 from lab work 03/17/2020 on care everywhere. --Will consult dietitian following endoscopic  procedures  Tobacco use:  Patient stated he no longer smokes cigarettes, but does smoke Black & Mild every now and then.  Counseled on need for complete cessation given his peripheral vascular disease.   DVT prophylaxis: SCDs, chemical DVT prophylaxis contraindicated in acute GI bleed Code Status: Full code Family Communication: No family present at bedside this morning Disposition Plan:  Status is: Inpatient  Remains inpatient appropriate because:Hemodynamically unstable, Ongoing diagnostic testing needed not appropriate for outpatient work up, Unsafe d/c plan, IV treatments appropriate due to intensity of illness or inability to take PO and Inpatient level of care appropriate due to severity of illness GI bleed, left common femoral artery occlusion   Dispo: The patient is from: Home              Anticipated d/c is to: Home              Anticipated d/c date is: 2 days              Patient currently is not medically stable to d/c.   Consultants:   Vascular surgery, Dr. Wanita Chamberlain gastroenterology, Dr. Penelope Coop  Procedures:   ABI: Summary:  Right: Resting right ankle-brachial index indicates critical limb  ischemia.  Absent waveforms in the DP, PT and great toe.  Left: Resting left ankle-brachial index indicates critical left limb  ischemia.  Absent waveforms in the DP, PT and great toe.   Vascular duplex ultrasound lower extremity: Summary:  Left: 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 Areas of  occlusion noted in the mid to distal SFA with reconstitution in the distal  SFA. Unable to visualize ATA.   Antimicrobials:   none   Subjective: Patient seen and examined bedside, resting comfortably.  States has improved feeling to left lower extremity.  Received 2 units of PRBCs overnight with improvement of his hemoglobin from 7.1-9.9 this morning.  Seen by vascular surgery, holding off any further intervention for left, femoral artery occlusion due to  anemia secondary to suspected GI bleed.  Gastroenterology planning EGD/colonoscopy tomorrow.  Patient without any other concerns or complaints this morning.  Denies headache, no fever/chills/night sweats, nausea/vomiting/diarrhea, no chest pain, no palpitations, no shortness of breath, no abdominal pain.  No acute events overnight per nursing staff.  Objective: Vitals:   04/09/20 2315 04/10/20 0038 04/10/20 0230 04/10/20 0258  BP: (!) 138/105 111/89 118/85 (!) 155/100  Pulse:  86 76 84  Resp: 17 18 12    Temp:  97.8 F (36.6 C)  98.3 F (36.8 C)  TempSrc:  Oral  Oral  SpO2:  100% 95% 100%  Weight:    42.3 kg  Height:    5\' 6"  (1.676 m)    Intake/Output Summary (Last 24 hours) at 04/10/2020 1145 Last data filed at 04/10/2020 0600 Gross per 24 hour  Intake 1243.17 ml  Output --  Net 1243.17 ml   Filed Weights   04/09/20 1022 04/10/20 0258  Weight: 43.4 kg 42.3 kg    Examination:  General exam: Appears calm and comfortable, thin/cachectic, chronically ill in appearance Respiratory system: Clear to  auscultation. Respiratory effort normal.  Oxygenating well on room air Cardiovascular system: S1 & S2 heard, RRR. No JVD, murmurs, rubs, gallops or clicks. No pedal edema. Gastrointestinal system: Abdomen is nondistended, soft and nontender. No organomegaly or masses felt. Normal bowel sounds heard. Central nervous system: Alert and oriented. No focal neurological deficits. Extremities: Symmetric 5 x 5 power. Skin: No rashes, lesions or ulcers Psychiatry: Judgement and insight appear poor. Mood & affect appropriate.     Data Reviewed: I have personally reviewed following labs and imaging studies  CBC: Recent Labs  Lab 04/03/20 2321 04/09/20 0915 04/09/20 0917 04/10/20 1008  WBC 7.0 8.8  --  15.4*  NEUTROABS  --  6.3  --   --   HGB 12.7* 7.0* 7.1* 9.9*  HCT 39.3 20.8* 21.0* 29.3*  MCV 91.4 90.0  --  85.7  PLT 332 238  --  782   Basic Metabolic Panel: Recent Labs  Lab  04/03/20 2321 04/09/20 0915 04/09/20 0917 04/10/20 1008  NA 133* 130* 134* 134*  K 4.1 3.5 3.6 3.6  CL 98 97* 96* 101  CO2 24 20*  --  22  GLUCOSE 119* 167* 167* 134*  BUN 13 34* 35* 23  CREATININE 1.12 1.08 1.00 1.07  CALCIUM 9.2 8.4*  --  8.7*   GFR: Estimated Creatinine Clearance: 36.8 mL/min (by C-G formula based on SCr of 1.07 mg/dL). Liver Function Tests: Recent Labs  Lab 04/09/20 0915  AST 15  ALT 10  ALKPHOS 44  BILITOT 0.7  PROT 5.2*  ALBUMIN 2.8*   No results for input(s): LIPASE, AMYLASE in the last 168 hours. No results for input(s): AMMONIA in the last 168 hours. Coagulation Profile: Recent Labs  Lab 04/09/20 0915  INR 1.1   Cardiac Enzymes: No results for input(s): CKTOTAL, CKMB, CKMBINDEX, TROPONINI in the last 168 hours. BNP (last 3 results) No results for input(s): PROBNP in the last 8760 hours. HbA1C: No results for input(s): HGBA1C in the last 72 hours. CBG: Recent Labs  Lab 04/09/20 0912  GLUCAP 169*   Lipid Profile: Recent Labs    04/10/20 1008  CHOL 111  HDL 33*  LDLCALC 57  TRIG 103  CHOLHDL 3.4   Thyroid Function Tests: No results for input(s): TSH, T4TOTAL, FREET4, T3FREE, THYROIDAB in the last 72 hours. Anemia Panel: No results for input(s): VITAMINB12, FOLATE, FERRITIN, TIBC, IRON, RETICCTPCT in the last 72 hours. Sepsis Labs: No results for input(s): PROCALCITON, LATICACIDVEN in the last 168 hours.  Recent Results (from the past 240 hour(s))  SARS Coronavirus 2 by RT PCR (hospital order, performed in [facility] hospital lab) Nasopharyngeal Nasopharyngeal Swab     Status: None   Collection Time: 04/09/20 12:47 PM   Specimen: Nasopharyngeal Swab  Result Value Ref Range Status   SARS Coronavirus 2 NEGATIVE NEGATIVE Final    Comment: (NOTE) SARS-CoV-2 target nucleic acids are NOT DETECTED.  The SARS-CoV-2 RNA is generally detectable in upper and lower respiratory specimens during the acute phase of infection. The  lowest concentration of SARS-CoV-2 viral copies this assay can detect is 250 copies / mL. A negative result does not preclude SARS-CoV-2 infection and should not be used as the sole basis for treatment or other patient management decisions.  A negative result may occur with improper specimen collection / handling, submission of specimen other than nasopharyngeal swab, presence of viral mutation(s) within the areas targeted by this assay, and inadequate number of viral copies (<250 copies / mL). A negative  result must be combined with clinical observations, patient history, and epidemiological information.  Fact Sheet for Patients:   StrictlyIdeas.no  Fact Sheet for Healthcare Providers: BankingDealers.co.za  This test is not yet approved or  cleared by the [place] FDA and has been authorized for detection and/or diagnosis of SARS-CoV-2 by FDA under an Emergency Use Authorization (EUA).  This EUA will remain in effect (meaning this test can be used) for the duration of the COVID-19 declaration under Section 564(b)(1) of the Act, 21 U.S.C. section 360bbb-3(b)(1), unless the authorization is terminated or revoked sooner.  Performed at [facility], [place] [address], [place], [place] [address]          Radiology Studies: CT Code Stroke CTA Head W/WO contrast  Result Date: 04/09/2020 CLINICAL DATA:  Focal neuro deficit, stroke suspected. Right-sided weakness. EXAM: CT ANGIOGRAPHY HEAD AND NECK TECHNIQUE: Multidetector CT imaging of the head and neck was performed using the standard protocol during bolus administration of intravenous contrast. Multiplanar CT image reconstructions and MIPs were obtained to evaluate the vascular anatomy. Carotid stenosis measurements (when applicable) are obtained utilizing NASCET criteria, using the distal internal carotid diameter as the denominator. CONTRAST:  72mL OMNIPAQUE IOHEXOL 350 MG/ML SOLN  COMPARISON:  No pertinent prior exams are available for comparison. FINDINGS: CTA NECK FINDINGS Aortic arch: Standard aortic branching. No hemodynamically significant innominate or proximal subclavian artery stenosis. Right carotid system: CCA and ICA patent within the neck without stenosis Left carotid system: CCA and ICA patent within the neck without stenosis. Vertebral arteries: Codominant and patent within the neck without stenosis Skeleton: No acute bony abnormality identified. Cervical spondylosis. Other neck: No neck mass or cervical lymphadenopathy. Upper chest: No consolidation within the imaged lung apices. Pulmonary emphysema Review of the MIP images confirms the above findings CTA HEAD FINDINGS Anterior circulation: The intracranial internal carotid arteries are patent. Minimal nonstenotic plaque within both vessels. The M1 middle cerebral arteries are patent without significant stenosis. No M2 proximal branch occlusion or high-grade proximal stenosis is identified. The anterior cerebral arteries are patent. No intracranial aneurysm is identified. Posterior circulation: The intracranial vertebral arteries are patent. The basilar artery is patent. The posterior cerebral arteries are patent. A left posterior communicating artery is present. The right posterior communicating artery is hypoplastic or absent. Venous sinuses: Within limitations of contrast timing, no convincing thrombus. Anatomic variants: As described Review of the MIP images confirms the above findings No emergent large vessel occlusion. These results were communicated to Dr. Leonel Ramsay At 9:45 amon 8/7/2021by text page via the [facility] messaging system. IMPRESSION: CTA neck: The common carotid, internal carotid and vertebral arteries are patent within the neck without hemodynamically significant stenosis. CTA head: 1. No intracranial large vessel occlusion or proximal high-grade arterial stenosis. 2. Mild non-stenotic plaque within both  intracranial internal carotid arteries. Electronically Signed   By: Kellie Simmering DO   On: 04/09/2020 09:45   CT Code Stroke CTA Neck W/WO contrast  Result Date: 04/09/2020 CLINICAL DATA:  Focal neuro deficit, stroke suspected. Right-sided weakness. EXAM: CT ANGIOGRAPHY HEAD AND NECK TECHNIQUE: Multidetector CT imaging of the head and neck was performed using the standard protocol during bolus administration of intravenous contrast. Multiplanar CT image reconstructions and MIPs were obtained to evaluate the vascular anatomy. Carotid stenosis measurements (when applicable) are obtained utilizing NASCET criteria, using the distal internal carotid diameter as the denominator. CONTRAST:  79mL OMNIPAQUE IOHEXOL 350 MG/ML SOLN COMPARISON:  No pertinent prior exams are available for comparison. FINDINGS:  CTA NECK FINDINGS Aortic arch: Standard aortic branching. No hemodynamically significant innominate or proximal subclavian artery stenosis. Right carotid system: CCA and ICA patent within the neck without stenosis Left carotid system: CCA and ICA patent within the neck without stenosis. Vertebral arteries: Codominant and patent within the neck without stenosis Skeleton: No acute bony abnormality identified. Cervical spondylosis. Other neck: No neck mass or cervical lymphadenopathy. Upper chest: No consolidation within the imaged lung apices. Pulmonary emphysema Review of the MIP images confirms the above findings CTA HEAD FINDINGS Anterior circulation: The intracranial internal carotid arteries are patent. Minimal nonstenotic plaque within both vessels. The M1 middle cerebral arteries are patent without significant stenosis. No M2 proximal branch occlusion or high-grade proximal stenosis is identified. The anterior cerebral arteries are patent. No intracranial aneurysm is identified. Posterior circulation: The intracranial vertebral arteries are patent. The basilar artery is patent. The posterior cerebral arteries are  patent. A left posterior communicating artery is present. The right posterior communicating artery is hypoplastic or absent. Venous sinuses: Within limitations of contrast timing, no convincing thrombus. Anatomic variants: As described Review of the MIP images confirms the above findings No emergent large vessel occlusion. These results were communicated to Dr. Leonel Ramsay At 9:45 amon 8/7/2021by text page via the [facility] messaging system. IMPRESSION: CTA neck: The common carotid, internal carotid and vertebral arteries are patent within the neck without hemodynamically significant stenosis. CTA head: 1. No intracranial large vessel occlusion or proximal high-grade arterial stenosis. 2. Mild non-stenotic plaque within both intracranial internal carotid arteries. Electronically Signed   By: Kellie Simmering DO   On: 04/09/2020 09:45   DG Chest Port 1 View  Result Date: 04/09/2020 CLINICAL DATA:  Weight loss, smoker EXAM: PORTABLE CHEST 1 VIEW COMPARISON:  04/04/2011 FINDINGS: Hyperinflation. No focal consolidation. No pleural effusion or pneumothorax. The heart is normal in size.  Thoracic aortic atherosclerosis. Calcified mediastinal/hilar lymph nodes. IMPRESSION: No evidence of acute cardiopulmonary disease. Electronically Signed   By: Julian Hy M.D.   On: 04/09/2020 10:45   VAS [place] ABI WITH/WO TBI  Result Date: 04/09/2020 LOWER EXTREMITY DOPPLER STUDY Indications: Rest pain, numbness, peripheral artery disease, and Known left              iliac occlusion. High Risk Factors: Hypertension.  Comparison Study: No prior study on file Performing Technologist: Sharion Dove RVS  Examination Guidelines: A complete evaluation includes at minimum, Doppler waveform signals and systolic blood pressure reading at the level of bilateral brachial, anterior tibial, and posterior tibial arteries, when vessel segments are accessible. Bilateral testing is considered an integral part of a complete examination. Photoelectric  Plethysmograph (PPG) waveforms and toe systolic pressure readings are included as required and additional duplex testing as needed. Limited examinations for reoccurring indications may be performed as noted.  ABI Findings: +---------+------------------+-----+---------+--------+ Right    Rt Pressure (mmHg)IndexWaveform Comment  +---------+------------------+-----+---------+--------+ Brachial 125                    triphasic         +---------+------------------+-----+---------+--------+ PTA                             absent            +---------+------------------+-----+---------+--------+ DP                              absent            +---------+------------------+-----+---------+--------+  Great Toe                       Absent            +---------+------------------+-----+---------+--------+ +---------+------------------+-----+--------+-------+ Left     Lt Pressure (mmHg)IndexWaveformComment +---------+------------------+-----+--------+-------+ PTA                             absent          +---------+------------------+-----+--------+-------+ DP                              absent          +---------+------------------+-----+--------+-------+ Great Toe                       Absent          +---------+------------------+-----+--------+-------+ +-------+-----------+-----------+------------+------------+ ABI/TBIToday's ABIToday's TBIPrevious ABIPrevious TBI +-------+-----------+-----------+------------+------------+ Right  absent     absent                              +-------+-----------+-----------+------------+------------+ Left   absent     absent                              +-------+-----------+-----------+------------+------------+  Summary: Right: Resting right ankle-brachial index indicates critical limb ischemia. Absent waveforms in the DP, PT and great toe. Left: Resting left ankle-brachial index indicates critical left limb  ischemia. Absent waveforms in the DP, PT and great toe.  *See table(s) above for measurements and observations.    Preliminary    CT HEAD CODE STROKE WO CONTRAST  Result Date: 04/09/2020 CLINICAL DATA:  Code stroke. Provided history: Right-sided weakness. EXAM: CT HEAD WITHOUT CONTRAST TECHNIQUE: Contiguous axial images were obtained from the base of the skull through the vertex without intravenous contrast. COMPARISON:  No pertinent prior exams are available for comparison. FINDINGS: Brain: Moderate generalized parenchymal atrophy. Moderate patchy T2/FLAIR hyperintensity within the cerebral white matter is nonspecific, but consistent with chronic small vessel ischemic disease. There is no acute intracranial hemorrhage. No demarcated cortical infarct is identified. No extra-axial fluid collection. No evidence of intracranial mass. No midline shift. Vascular: No hyperdense vessel.  Atherosclerotic calcifications. Skull: Normal. Negative for fracture or focal lesion. Sinuses/Orbits: Visualized orbits show no acute finding. Complete opacification of the left maxillary sinus with associated chronic reactive osteitis. Extensive partial opacification of the left frontal sinus with associated frothy secretions. No significant mastoid effusion. ASPECTS ([place] Stroke Program Early CT Score) - Ganglionic level infarction (caudate, lentiform nuclei, internal capsule, insula, M1-M3 cortex): 7 - Supraganglionic infarction (M4-M6 cortex): 3 Total score (0-10 with 10 being normal): 10 These results were communicated to Dr. Leonel Ramsay At 9:26 amon 8/7/2021by text page via the [facility] messaging system. IMPRESSION: No CT evidence of acute intracranial abnormality. Moderate generalized parenchymal atrophy and chronic small vessel ischemic disease. Left frontal and maxillary sinusitis as described. Electronically Signed   By: Kellie Simmering DO   On: 04/09/2020 09:26   VAS [place] LOWER EXTREMITY ARTERIAL DUPLEX  Result Date:  04/09/2020 LOWER EXTREMITY ARTERIAL DUPLEX STUDY Indications: Rest pain and numbness, peripheral artery disease, and known left              iliac occlusion. High Risk Factors: Hypertension.  Current ABI: absent pulses bilaterally Comparison Study:  No prior study on file Performing Technologist: Sharion Dove RVS  Examination Guidelines: A complete evaluation includes B-mode imaging, spectral Doppler, color Doppler, and power Doppler as needed of all accessible portions of each vessel. Bilateral testing is considered an integral part of a complete examination. Limited examinations for reoccurring indications may be performed as noted.  +----------+--------+-----+--------+----------+--------------------------------+ LEFT      PSV cm/sRatioStenosisWaveform  Comments                         +----------+--------+-----+--------+----------+--------------------------------+ CFA Prox               occludedabsent                                     +----------+--------+-----+--------+----------+--------------------------------+ CFA Mid                occludedabsent                                     +----------+--------+-----+--------+----------+--------------------------------+ CFA Distal             occludedabsent                                     +----------+--------+-----+--------+----------+--------------------------------+ DFA       45                                                              +----------+--------+-----+--------+----------+--------------------------------+ SFA Prox  47                   monophasic                                 +----------+--------+-----+--------+----------+--------------------------------+ SFA Mid   31                   monophasicsome areas of occlusion noted                                             mid to distal                    +----------+--------+-----+--------+----------+--------------------------------+ SFA  Distal11                   monophasicreconstituted flow               +----------+--------+-----+--------+----------+--------------------------------+ POP Prox  7                    monophasic                                 +----------+--------+-----+--------+----------+--------------------------------+ POP Mid                        monophasic                                 +----------+--------+-----+--------+----------+--------------------------------+  POP Distal10                                                              +----------+--------+-----+--------+----------+--------------------------------+ ATA Prox                                 not visualized                   +----------+--------+-----+--------+----------+--------------------------------+ ATA Mid                                  not visualized                   +----------+--------+-----+--------+----------+--------------------------------+ ATA Distal                               not visualized                   +----------+--------+-----+--------+----------+--------------------------------+ PTA Prox  13                   monophasic                                 +----------+--------+-----+--------+----------+--------------------------------+ PTA Mid   10                   monophasic                                 +----------+--------+-----+--------+----------+--------------------------------+ PTA Distal7                    monophasic                                 +----------+--------+-----+--------+----------+--------------------------------+  Summary: Left: 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 Areas of occlusion noted in the mid to distal SFA with reconstitution in the distal SFA. Unable to visualize ATA.  See table(s) above for measurements and observations. Electronically signed by Harold Barban MD on 04/09/2020 at 5:49:46 PM.    Final          Scheduled Meds: . atorvastatin  40 mg Oral Daily  . ferrous sulfate  325 mg Oral Daily  . pantoprazole (PROTONIX) IV  40 mg Intravenous Q12H  . polyethylene glycol-electrolytes  4,000 mL Oral Once  . sodium chloride flush  3 mL Intravenous Once  . sodium chloride flush  3 mL Intravenous Q12H   Continuous Infusions: . sodium chloride    . sodium chloride 75 mL/hr at 04/10/20 0320     LOS: 1 day    Time spent: 37 minutes spent on chart review, discussion with nursing staff, consultants, updating family and interview/physical exam; more than 50% of that time was spent in counseling and/or coordination of care.    Delroy Ordway J [place] DO [facility] Available via Epic secure chat 7am-7pm After these hours, please refer to coverage provider listed  on [email or web address] 04/10/2020, 11:45 AM

## 2020-04-10 NOTE — Progress Notes (Signed)
    Subjective  -   States his left leg does not bother him this am   Physical Exam:  Sensation to left leg has improved, and he can now feel down to his ankle Normal movement of toes Pedal pulses not palpable       Assessment/Plan:    Suspect numbness was duet to a low flow state given his anemia and hypotensiosn.  I would delay further vascular work up until GI issues are resolved, as he will require IV heparin for angiogram.  Keith Morales 04/10/2020 8:38 AM --  Vitals:   04/10/20 0230 04/10/20 0258  BP: 118/85 (!) 155/100  Pulse: 76 84  Resp: 12   Temp:  98.3 F (36.8 C)  SpO2: 95% 100%    Intake/Output Summary (Last 24 hours) at 04/10/2020 0838 Last data filed at 04/10/2020 0600 Gross per 24 hour  Intake 1368.17 ml  Output --  Net 1368.17 ml     Laboratory CBC    Component Value Date/Time   WBC 8.8 04/09/2020 0915   HGB 7.1 (L) 04/09/2020 0917   HCT 21.0 (L) 04/09/2020 0917   PLT 238 04/09/2020 0915    BMET    Component Value Date/Time   NA 134 (L) 04/09/2020 0917   K 3.6 04/09/2020 0917   CL 96 (L) 04/09/2020 0917   CO2 20 (L) 04/09/2020 0915   GLUCOSE 167 (H) 04/09/2020 0917   BUN 35 (H) 04/09/2020 0917   CREATININE 1.00 04/09/2020 0917   CALCIUM 8.4 (L) 04/09/2020 0915   GFRNONAA >60 04/09/2020 0915   GFRAA >60 04/09/2020 0915    COAG Lab Results  Component Value Date   INR 1.1 04/09/2020   No results found for: PTT  Antibiotics Anti-infectives (From admission, onward)   None       V. Leia Alf, M.D., [facility] [place] Office: [phone] Pager:  [phone]

## 2020-04-10 NOTE — H&P (View-Only) (Signed)
[facility] Progress Note  Subjective: Patient feels better today.  We talked about his melena, weight loss, anemia, and history of colon polyps in the past without a follow-up exam.  We talked about proceeding with EGD and colonoscopy which she is agreeable to.  Objective: Vital signs in last 24 hours: Temp:  [97.8 F (36.6 C)-99 F (37.2 C)] 98.3 F (36.8 C) (08/08 0258) Pulse Rate:  [76-111] 84 (08/08 0258) Resp:  [12-20] 12 (08/08 0230) BP: (111-155)/(81-106) 155/100 (08/08 0258) SpO2:  [95 %-100 %] 100 % (08/08 0258) Weight:  [42.3 kg-43.4 kg] 42.3 kg (08/08 0258) Weight change:    PE:  No distress  Heart regular rhythm  Abdomen soft nontender  Lab Results: Results for orders placed or performed during the hospital encounter of 04/09/20 (from the past 24 hour(s))  SARS Coronavirus 2 by RT PCR (hospital order, performed in [place] hospital lab) Nasopharyngeal Nasopharyngeal Swab     Status: None   Collection Time: 04/09/20 12:47 PM   Specimen: Nasopharyngeal Swab  Result Value Ref Range   SARS Coronavirus 2 NEGATIVE NEGATIVE  Prepare RBC (crossmatch)     Status: None   Collection Time: 04/09/20  1:15 PM  Result Value Ref Range   Order Confirmation      ORDER PROCESSED BY BLOOD BANK Performed at [facility], [address] [place], [place] [address]   Type and screen [place]     Status: None (Preliminary result)   Collection Time: 04/09/20  1:15 PM  Result Value Ref Range   ABO/RH(D) A POS    Antibody Screen NEG    Sample Expiration 04/12/2020,2359    Unit Number [id number]    Blood Component Type RED CELLS,LR    Unit division 00    Status of Unit ISSUED    Transfusion Status OK TO TRANSFUSE    Crossmatch Result Compatible    Unit Number [id number]    Blood Component Type RED CELLS,LR    Unit division 00    Status of Unit ISSUED    Transfusion Status OK TO TRANSFUSE    Crossmatch Result       Compatible Performed at [facility], [place] [address], [place], [place] [address]   ABO/Rh     Status: None   Collection Time: 04/09/20  5:45 PM  Result Value Ref Range   ABO/RH(D)      A POS Performed at [place] [address] [place], [place] [address]     Studies/Results: DG Chest Port 1 View  Result Date: 04/09/2020 CLINICAL DATA:  Weight loss, smoker EXAM: PORTABLE CHEST 1 VIEW COMPARISON:  04/04/2011 FINDINGS: Hyperinflation. No focal consolidation. No pleural effusion or pneumothorax. The heart is normal in size.  Thoracic aortic atherosclerosis. Calcified mediastinal/hilar lymph nodes. IMPRESSION: No evidence of acute cardiopulmonary disease. Electronically Signed   By: Julian Hy M.D.   On: 04/09/2020 10:45   VAS [place] ABI WITH/WO TBI  Result Date: 04/09/2020 LOWER EXTREMITY DOPPLER STUDY Indications: Rest pain, numbness, peripheral artery disease, and Known left              iliac occlusion. High Risk Factors: Hypertension.  Comparison Study: No prior study on file Performing Technologist: Sharion Dove RVS  Examination Guidelines: A complete evaluation includes at minimum, Doppler waveform signals and systolic blood pressure reading at the level of bilateral brachial, anterior tibial, and posterior tibial arteries, when vessel segments are accessible. Bilateral testing is considered  an integral part of a complete examination. Photoelectric Plethysmograph (PPG) waveforms and toe systolic pressure readings are included as required and additional duplex testing as needed. Limited examinations for reoccurring indications may be performed as noted.  ABI Findings: +---------+------------------+-----+---------+--------+ Right    Rt Pressure (mmHg)IndexWaveform Comment  +---------+------------------+-----+---------+--------+ Brachial 125                    triphasic         +---------+------------------+-----+---------+--------+ PTA                              absent            +---------+------------------+-----+---------+--------+ DP                              absent            +---------+------------------+-----+---------+--------+ Great Toe                       Absent            +---------+------------------+-----+---------+--------+ +---------+------------------+-----+--------+-------+ Left     Lt Pressure (mmHg)IndexWaveformComment +---------+------------------+-----+--------+-------+ PTA                             absent          +---------+------------------+-----+--------+-------+ DP                              absent          +---------+------------------+-----+--------+-------+ Great Toe                       Absent          +---------+------------------+-----+--------+-------+ +-------+-----------+-----------+------------+------------+ ABI/TBIToday's ABIToday's TBIPrevious ABIPrevious TBI +-------+-----------+-----------+------------+------------+ Right  absent     absent                              +-------+-----------+-----------+------------+------------+ Left   absent     absent                              +-------+-----------+-----------+------------+------------+  Summary: Right: Resting right ankle-brachial index indicates critical limb ischemia. Absent waveforms in the DP, PT and great toe. Left: Resting left ankle-brachial index indicates critical left limb ischemia. Absent waveforms in the DP, PT and great toe.  *See table(s) above for measurements and observations.    Preliminary    VAS [place] LOWER EXTREMITY ARTERIAL DUPLEX  Result Date: 04/09/2020 LOWER EXTREMITY ARTERIAL DUPLEX STUDY Indications: Rest pain and numbness, peripheral artery disease, and known left              iliac occlusion. High Risk Factors: Hypertension.  Current ABI: absent pulses bilaterally Comparison Study: No prior study on file Performing Technologist: Sharion Dove RVS  Examination Guidelines: A complete  evaluation includes B-mode imaging, spectral Doppler, color Doppler, and power Doppler as needed of all accessible portions of each vessel. Bilateral testing is considered an integral part of a complete examination. Limited examinations for reoccurring indications may be performed as noted.  +----------+--------+-----+--------+----------+--------------------------------+ LEFT      PSV cm/sRatioStenosisWaveform  Comments                         +----------+--------+-----+--------+----------+--------------------------------+  CFA Prox               occludedabsent                                     +----------+--------+-----+--------+----------+--------------------------------+ CFA Mid                occludedabsent                                     +----------+--------+-----+--------+----------+--------------------------------+ CFA Distal             occludedabsent                                     +----------+--------+-----+--------+----------+--------------------------------+ DFA       45                                                              +----------+--------+-----+--------+----------+--------------------------------+ SFA Prox  47                   monophasic                                 +----------+--------+-----+--------+----------+--------------------------------+ SFA Mid   31                   monophasicsome areas of occlusion noted                                             mid to distal                    +----------+--------+-----+--------+----------+--------------------------------+ SFA Distal11                   monophasicreconstituted flow               +----------+--------+-----+--------+----------+--------------------------------+ POP Prox  7                    monophasic                                 +----------+--------+-----+--------+----------+--------------------------------+ POP Mid                         monophasic                                 +----------+--------+-----+--------+----------+--------------------------------+ POP Distal10                                                              +----------+--------+-----+--------+----------+--------------------------------+  ATA Prox                                 not visualized                   +----------+--------+-----+--------+----------+--------------------------------+ ATA Mid                                  not visualized                   +----------+--------+-----+--------+----------+--------------------------------+ ATA Distal                               not visualized                   +----------+--------+-----+--------+----------+--------------------------------+ PTA Prox  13                   monophasic                                 +----------+--------+-----+--------+----------+--------------------------------+ PTA Mid   10                   monophasic                                 +----------+--------+-----+--------+----------+--------------------------------+ PTA Distal7                    monophasic                                 +----------+--------+-----+--------+----------+--------------------------------+  Summary: Left: 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 Areas of occlusion noted in the mid to distal SFA with reconstitution in the distal SFA. Unable to visualize ATA.  See table(s) above for measurements and observations. Electronically signed by Harold Barban MD on 04/09/2020 at 5:49:46 PM.    Final       Assessment: Anemia, weight loss, melena, history of colon polyps without follow-up.  Plan:   EGD and colonoscopy tomorrow.  Continue clear liquids today and prep for procedures tomorrow.    SAM F Ramadan Couey 04/10/2020, 10:13 AM  Pager: [phone] If no answer or after 5 PM call [phone] Lab Results  Component Value Date   HGB 7.1 (L) 04/09/2020    HGB 7.0 (L) 04/09/2020   HGB 12.7 (L) 04/03/2020   HCT 21.0 (L) 04/09/2020   HCT 20.8 (L) 04/09/2020   HCT 39.3 04/03/2020   ALKPHOS 44 04/09/2020   ALKPHOS 96 08/11/2008   AST 15 04/09/2020   AST 22 08/11/2008   ALT 10 04/09/2020   ALT 13 08/11/2008

## 2020-04-10 NOTE — Progress Notes (Signed)
[facility] Progress Note  Subjective: Patient feels better today.  We talked about his melena, weight loss, anemia, and history of colon polyps in the past without a follow-up exam.  We talked about proceeding with EGD and colonoscopy which she is agreeable to.  Objective: Vital signs in last 24 hours: Temp:  [97.8 F (36.6 C)-99 F (37.2 C)] 98.3 F (36.8 C) (08/08 0258) Pulse Rate:  [76-111] 84 (08/08 0258) Resp:  [12-20] 12 (08/08 0230) BP: (111-155)/(81-106) 155/100 (08/08 0258) SpO2:  [95 %-100 %] 100 % (08/08 0258) Weight:  [42.3 kg-43.4 kg] 42.3 kg (08/08 0258) Weight change:    PE:  No distress  Heart regular rhythm  Abdomen soft nontender  Lab Results: Results for orders placed or performed during the hospital encounter of 04/09/20 (from the past 24 hour(s))  SARS Coronavirus 2 by RT PCR (hospital order, performed in [place] hospital lab) Nasopharyngeal Nasopharyngeal Swab     Status: None   Collection Time: 04/09/20 12:47 PM   Specimen: Nasopharyngeal Swab  Result Value Ref Range   SARS Coronavirus 2 NEGATIVE NEGATIVE  Prepare RBC (crossmatch)     Status: None   Collection Time: 04/09/20  1:15 PM  Result Value Ref Range   Order Confirmation      ORDER PROCESSED BY BLOOD BANK Performed at [facility], [address], [place], [place] [address]   Type and screen [place]     Status: None (Preliminary result)   Collection Time: 04/09/20  1:15 PM  Result Value Ref Range   ABO/RH(D) A POS    Antibody Screen NEG    Sample Expiration 04/12/2020,2359    Unit Number [id number]    Blood Component Type RED CELLS,LR    Unit division 00    Status of Unit ISSUED    Transfusion Status OK TO TRANSFUSE    Crossmatch Result Compatible    Unit Number [id number]    Blood Component Type RED CELLS,LR    Unit division 00    Status of Unit ISSUED    Transfusion Status OK TO TRANSFUSE    Crossmatch Result       Compatible Performed at [facility], [place] [address], [place], [place] [address]   ABO/Rh     Status: None   Collection Time: 04/09/20  5:45 PM  Result Value Ref Range   ABO/RH(D)      A POS Performed at [place] [address] [place], [place] [address]     Studies/Results: DG Chest Port 1 View  Result Date: 04/09/2020 CLINICAL DATA:  Weight loss, smoker EXAM: PORTABLE CHEST 1 VIEW COMPARISON:  04/04/2011 FINDINGS: Hyperinflation. No focal consolidation. No pleural effusion or pneumothorax. The heart is normal in size.  Thoracic aortic atherosclerosis. Calcified mediastinal/hilar lymph nodes. IMPRESSION: No evidence of acute cardiopulmonary disease. Electronically Signed   By: Julian Hy M.D.   On: 04/09/2020 10:45   VAS [place] ABI WITH/WO TBI  Result Date: 04/09/2020 LOWER EXTREMITY DOPPLER STUDY Indications: Rest pain, numbness, peripheral artery disease, and Known left              iliac occlusion. High Risk Factors: Hypertension.  Comparison Study: No prior study on file Performing Technologist: Sharion Dove RVS  Examination Guidelines: A complete evaluation includes at minimum, Doppler waveform signals and systolic blood pressure reading at the level of bilateral brachial, anterior tibial, and posterior tibial arteries, when vessel segments are accessible. Bilateral testing is considered  an integral part of a complete examination. Photoelectric Plethysmograph (PPG) waveforms and toe systolic pressure readings are included as required and additional duplex testing as needed. Limited examinations for reoccurring indications may be performed as noted.  ABI Findings: +---------+------------------+-----+---------+--------+ Right    Rt Pressure (mmHg)IndexWaveform Comment  +---------+------------------+-----+---------+--------+ Brachial 125                    triphasic         +---------+------------------+-----+---------+--------+ PTA                              absent            +---------+------------------+-----+---------+--------+ DP                              absent            +---------+------------------+-----+---------+--------+ Great Toe                       Absent            +---------+------------------+-----+---------+--------+ +---------+------------------+-----+--------+-------+ Left     Lt Pressure (mmHg)IndexWaveformComment +---------+------------------+-----+--------+-------+ PTA                             absent          +---------+------------------+-----+--------+-------+ DP                              absent          +---------+------------------+-----+--------+-------+ Great Toe                       Absent          +---------+------------------+-----+--------+-------+ +-------+-----------+-----------+------------+------------+ ABI/TBIToday's ABIToday's TBIPrevious ABIPrevious TBI +-------+-----------+-----------+------------+------------+ Right  absent     absent                              +-------+-----------+-----------+------------+------------+ Left   absent     absent                              +-------+-----------+-----------+------------+------------+  Summary: Right: Resting right ankle-brachial index indicates critical limb ischemia. Absent waveforms in the DP, PT and great toe. Left: Resting left ankle-brachial index indicates critical left limb ischemia. Absent waveforms in the DP, PT and great toe.  *See table(s) above for measurements and observations.    Preliminary    VAS [place] LOWER EXTREMITY ARTERIAL DUPLEX  Result Date: 04/09/2020 LOWER EXTREMITY ARTERIAL DUPLEX STUDY Indications: Rest pain and numbness, peripheral artery disease, and known left              iliac occlusion. High Risk Factors: Hypertension.  Current ABI: absent pulses bilaterally Comparison Study: No prior study on file Performing Technologist: Sharion Dove RVS  Examination Guidelines: A complete  evaluation includes B-mode imaging, spectral Doppler, color Doppler, and power Doppler as needed of all accessible portions of each vessel. Bilateral testing is considered an integral part of a complete examination. Limited examinations for reoccurring indications may be performed as noted.  +----------+--------+-----+--------+----------+--------------------------------+ LEFT      PSV cm/sRatioStenosisWaveform  Comments                         +----------+--------+-----+--------+----------+--------------------------------+  CFA Prox               occludedabsent                                     +----------+--------+-----+--------+----------+--------------------------------+ CFA Mid                occludedabsent                                     +----------+--------+-----+--------+----------+--------------------------------+ CFA Distal             occludedabsent                                     +----------+--------+-----+--------+----------+--------------------------------+ DFA       45                                                              +----------+--------+-----+--------+----------+--------------------------------+ SFA Prox  47                   monophasic                                 +----------+--------+-----+--------+----------+--------------------------------+ SFA Mid   31                   monophasicsome areas of occlusion noted                                             mid to distal                    +----------+--------+-----+--------+----------+--------------------------------+ SFA Distal11                   monophasicreconstituted flow               +----------+--------+-----+--------+----------+--------------------------------+ POP Prox  7                    monophasic                                 +----------+--------+-----+--------+----------+--------------------------------+ POP Mid                         monophasic                                 +----------+--------+-----+--------+----------+--------------------------------+ POP Distal10                                                              +----------+--------+-----+--------+----------+--------------------------------+  ATA Prox                                 not visualized                   +----------+--------+-----+--------+----------+--------------------------------+ ATA Mid                                  not visualized                   +----------+--------+-----+--------+----------+--------------------------------+ ATA Distal                               not visualized                   +----------+--------+-----+--------+----------+--------------------------------+ PTA Prox  13                   monophasic                                 +----------+--------+-----+--------+----------+--------------------------------+ PTA Mid   10                   monophasic                                 +----------+--------+-----+--------+----------+--------------------------------+ PTA Distal7                    monophasic                                 +----------+--------+-----+--------+----------+--------------------------------+  Summary: Left: 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 Areas of occlusion noted in the mid to distal SFA with reconstitution in the distal SFA. Unable to visualize ATA.  See table(s) above for measurements and observations. Electronically signed by Harold Barban MD on 04/09/2020 at 5:49:46 PM.    Final       Assessment: Anemia, weight loss, melena, history of colon polyps without follow-up.  Plan:   EGD and colonoscopy tomorrow.  Continue clear liquids today and prep for procedures tomorrow.    SAM F Kayon Dozier 04/10/2020, 10:13 AM  Pager: [phone] If no answer or after 5 PM call [phone] Lab Results  Component Value Date   HGB 7.1 (L) 04/09/2020    HGB 7.0 (L) 04/09/2020   HGB 12.7 (L) 04/03/2020   HCT 21.0 (L) 04/09/2020   HCT 20.8 (L) 04/09/2020   HCT 39.3 04/03/2020   ALKPHOS 44 04/09/2020   ALKPHOS 96 08/11/2008   AST 15 04/09/2020   AST 22 08/11/2008   ALT 10 04/09/2020   ALT 13 08/11/2008

## 2020-04-10 NOTE — Plan of Care (Signed)

## 2020-04-10 NOTE — ED Notes (Addendum)
Attempted report x1. 

## 2020-04-11 ENCOUNTER — Encounter (HOSPITAL_COMMUNITY): Payer: Self-pay | Admitting: Internal Medicine

## 2020-04-11 ENCOUNTER — Encounter (HOSPITAL_COMMUNITY)
Admission: EM | Disposition: A | Discharge status: Home / Self Care | Payer: Self-pay | Source: Home / Self Care | Attending: Internal Medicine

## 2020-04-11 ENCOUNTER — Inpatient Hospital Stay (HOSPITAL_COMMUNITY): Payer: Medicare Other | Admitting: Certified Registered Nurse Anesthetist

## 2020-04-11 HISTORY — PX: ESOPHAGOGASTRODUODENOSCOPY (EGD) WITH PROPOFOL: SHX5813

## 2020-04-11 HISTORY — PX: POLYPECTOMY: SHX5525

## 2020-04-11 HISTORY — PX: BIOPSY: SHX5522

## 2020-04-11 HISTORY — PX: COLONOSCOPY WITH PROPOFOL: SHX5780

## 2020-04-11 LAB — BASIC METABOLIC PANEL
Anion gap: 9 (ref 5–15)
BUN: 19 mg/dL (ref 8–23)
CO2: 23 mmol/L (ref 22–32)
Calcium: 8.8 mg/dL — ABNORMAL LOW (ref 8.9–10.3)
Chloride: 103 mmol/L (ref 98–111)
Creatinine, Ser: 1.23 mg/dL (ref 0.61–1.24)
GFR calc Af Amer: >60 mL/min (ref >60)
GFR calc non Af Amer: 58 mL/min — ABNORMAL LOW (ref >60)
Glucose, Bld: 118 mg/dL — ABNORMAL HIGH (ref 70–99)
Potassium: 3.3 mmol/L — ABNORMAL LOW (ref 3.5–5.1)
Sodium: 135 mmol/L (ref 135–145)

## 2020-04-11 LAB — CBC
HCT: 30.7 % — ABNORMAL LOW (ref 39.0–52.0)
Hemoglobin: 10.3 g/dL — ABNORMAL LOW (ref 13.0–17.0)
MCH: 29.8 pg (ref 26.0–34.0)
MCHC: 33.6 g/dL (ref 30.0–36.0)
MCV: 88.7 fL (ref 80.0–100.0)
Platelets: 298 10*3/uL (ref 150–400)
RBC: 3.46 MIL/uL — ABNORMAL LOW (ref 4.22–5.81)
RDW: 14.3 % (ref 11.5–15.5)
WBC: 21.4 10*3/uL — ABNORMAL HIGH (ref 4.0–10.5)
nRBC: 0.1 % (ref 0.0–0.2)

## 2020-04-11 SURGERY — ESOPHAGOGASTRODUODENOSCOPY (EGD) WITH PROPOFOL
Anesthesia: Monitor Anesthesia Care

## 2020-04-11 MED ORDER — PROPOFOL 10 MG/ML IV BOLUS
INTRAVENOUS | Status: DC | PRN
  Administered 2020-04-11: 20 mg via INTRAVENOUS
  Administered 2020-04-11: 30 mg via INTRAVENOUS

## 2020-04-11 MED ORDER — LIDOCAINE HCL (CARDIAC) PF 100 MG/5ML IV SOSY
PREFILLED_SYRINGE | INTRAVENOUS | Status: DC | PRN
  Administered 2020-04-11: 80 mg via INTRATRACHEAL

## 2020-04-11 MED ORDER — SODIUM CHLORIDE 0.9 % IV SOLN: INTRAVENOUS | Status: DC | PRN

## 2020-04-11 MED ORDER — PROPOFOL 500 MG/50ML IV EMUL
INTRAVENOUS | Status: DC | PRN
  Administered 2020-04-11: 100 ug/kg/min via INTRAVENOUS

## 2020-04-11 SURGICAL SUPPLY — 25 items

## 2020-04-11 NOTE — Transfer of Care (Signed)
Immediate Anesthesia Transfer of Care Note  Patient: Keith Morales  Procedure(s) Performed: ESOPHAGOGASTRODUODENOSCOPY (EGD) WITH PROPOFOL (N/A ) COLONOSCOPY WITH PROPOFOL (N/A ) BIOPSY POLYPECTOMY  Patient Location: Endoscopy Unit  Anesthesia Type:MAC  Level of Consciousness: drowsy and patient cooperative  Airway & Oxygen Therapy: Patient Spontanous Breathing and Patient connected to nasal cannula oxygen  Post-op Assessment: Report given to RN and Post -op Vital signs reviewed and stable  Post vital signs: Reviewed and stable  Last Vitals:  Vitals Value Taken Time  BP    Temp    Pulse    Resp 12 04/11/20 1006  SpO2 100   Vitals shown include unvalidated device data.  Last Pain:  Vitals:   04/11/20 0758  TempSrc: Oral  PainSc: 0-No pain         Complications: No complications documented.

## 2020-04-11 NOTE — Op Note (Signed)
[facility] Patient Name: Keith Morales Procedure Date : 04/11/2020 MRN: [id number] Attending MD: Otis Brace , MD Date of Birth: 03-22-1946 CSN: [id number] Age: 74 Admit Type: Inpatient Procedure:                Upper GI endoscopy Indications:              Iron deficiency anemia, Weight loss Providers:                Otis Brace, MD, Clyde Lundborg, RN, Laverda Sorenson, Technician, Tyna Jaksch Technician Referring MD:              Medicines:                Sedation Administered by an Anesthesia Professional Complications:            No immediate complications. Estimated Blood Loss:     Estimated blood loss was minimal. Procedure:                Pre-Anesthesia Assessment:                           - Prior to the procedure, a History and Physical                            was performed, and patient medications and                            allergies were reviewed. The patient's tolerance of                            previous anesthesia was also reviewed. The risks                            and benefits of the procedure and the sedation                            options and risks were discussed with the patient.                            All questions were answered, and informed consent                            was obtained. Prior Anticoagulants: The patient has                            taken no previous anticoagulant or antiplatelet                            agents. ASA Grade Assessment: III - A patient with                            severe systemic disease. After reviewing the risks  and benefits, the patient was deemed in                            satisfactory condition to undergo the procedure.                           After obtaining informed consent, the endoscope was                            passed under direct vision. Throughout the                            procedure, the patient's  blood pressure, pulse, and                            oxygen saturations were monitored continuously. The                            [id number]-H190 ([id number] Olympus gastroscope was                            introduced through the mouth, and advanced to the                            second part of duodenum. The upper GI endoscopy was                            accomplished without difficulty. The patient                            tolerated the procedure well. Scope In: Scope Out: Findings:      A non-obstructing Schatzki ring was found at the gastroesophageal       junction.      A small hiatal hernia was present.      One non-bleeding cratered gastric ulcer with pigmented material was       found at the incisura. The lesion was 15 mm in largest dimension.       Biopsies were taken with a cold forceps for histology.      Diffuse moderate inflammation characterized by erythema and friability       was found in the entire examined stomach.      The cardia and gastric fundus were normal on retroflexion.      The duodenal bulb, first portion of the duodenum and second portion of       the duodenum were normal. Impression:               - Non-obstructing Schatzki ring.                           - Small hiatal hernia.                           - Non-bleeding gastric ulcer with pigmented                            material.  Biopsied.                           - Chronic gastritis.                           - Normal duodenal bulb, first portion of the                            duodenum and second portion of the duodenum. Recommendation:           - Return patient to hospital ward for ongoing care.                           - Soft diet.                           - Continue present medications.                           - Await pathology results.                           - Repeat upper endoscopy in 2 months to check                            healing. Procedure Code(s):        --- Professional  ---                           [phone], Esophagogastroduodenoscopy, flexible,                            transoral; with biopsy, single or multiple Diagnosis Code(s):        --- Professional ---                           K22.2, Esophageal obstruction                           K44.9, Diaphragmatic hernia without obstruction or                            gangrene                           K25.9, Gastric ulcer, unspecified as acute or                            chronic, without hemorrhage or perforation                           K29.50, Unspecified chronic gastritis without                            bleeding                           D50.9, Iron deficiency anemia,  unspecified                           R63.4, Abnormal weight loss CPT copyright 2019 American Medical Association. All rights reserved. The codes documented in this report are preliminary and upon coder review may  be revised to meet current compliance requirements. Otis Brace, MD Otis Brace, MD 04/11/2020 10:10:05 AM Number of Addenda: 0

## 2020-04-11 NOTE — Anesthesia Preprocedure Evaluation (Signed)
Anesthesia Evaluation  Patient identified by MRN, date of birth, ID band Patient awake    Reviewed: Allergy & Precautions, NPO status , Patient's Chart, lab work & pertinent test results  History of Anesthesia Complications Negative for: history of anesthetic complications  Airway Mallampati: II  TM Distance: >3 FB Neck ROM: Full    Dental  (+) Poor Dentition, Dental Advisory Given, Missing   Pulmonary COPD, neg recent URI, Current Smoker and Patient abstained from smoking.,    breath sounds clear to auscultation       Cardiovascular hypertension, Pt. on medications + Peripheral Vascular Disease   Rhythm:Regular     Neuro/Psych negative neurological ROS  negative psych ROS   GI/Hepatic Neg liver ROS, ? GI bleed   Endo/Other  negative endocrine ROS  Renal/GU negative Renal ROS     Musculoskeletal negative musculoskeletal ROS (+)   Abdominal   Peds  Hematology  (+) anemia ,   Anesthesia Other Findings   Reproductive/Obstetrics                             Anesthesia Physical Anesthesia Plan  ASA: III  Anesthesia Plan: MAC   Post-op Pain Management:    Induction: Intravenous  PONV Risk Score and Plan: 0 and Propofol infusion and Treatment may vary due to age or medical condition  Airway Management Planned: Nasal Cannula  Additional Equipment: None  Intra-op Plan:   Post-operative Plan:   Informed Consent: I have reviewed the patients History and Physical, chart, labs and discussed the procedure including the risks, benefits and alternatives for the proposed anesthesia with the patient or authorized representative who has indicated his/her understanding and acceptance.       Plan Discussed with: CRNA and Surgeon  Anesthesia Plan Comments:         Anesthesia Quick Evaluation

## 2020-04-11 NOTE — Anesthesia Procedure Notes (Signed)
Procedure Name: MAC Date/Time: 04/11/2020 9:11 AM Performed by: Kathryne Hitch, CRNA Pre-anesthesia Checklist: Patient identified, Emergency Drugs available, Suction available and Patient being monitored Patient Re-evaluated:Patient Re-evaluated prior to induction Oxygen Delivery Method: Nasal cannula Preoxygenation: Pre-oxygenation with 100% oxygen Induction Type: IV induction Dental Injury: Teeth and Oropharynx as per pre-operative assessment

## 2020-04-11 NOTE — Progress Notes (Signed)
PROGRESS NOTE    [name]  QIO:[id number] DOB: 05-08-1946 DOA: 04/09/2020 PCP: Miguel Aschoff, MD    Brief Narrative:  Keith Morales is a 74 y.o. male with medical history significant of hypertension and COPD who presented with complaints of left leg numbness and pain.  Over the last couple weeks to months he has been having abdominal pain, decreased appetite, and weight loss of at least 30 pounds.  He has only been able to drink liquids despite family's attempts.  Over the last week or so he has been having dark stools.  He last had 2 bowel movements yesterday.  Denies being on any blood thinners are taking any NSAIDs.  Knee pain on the left leg started acutely today and thereafter he developed numbness for which she was unable to get up and ambulate.  He reports that he last had a colonoscopy with Dr.[facility] few years ago was noted to have a couple polyps which were removed. 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  He quit smoking cigarettes over 20 years ago, but intermittently smokes Black & Mild every now and then.  Patient was seen as a code stroke initially upon arrival into the emergency department due to his complaints.  CT angiogram of the head and neck was negative for any acute abnormalities.  Vital signs were noted to be stable.  After further evaluation symptoms were thought more likely vascular in nature and code stroke was canceled.  Labs significant for hemoglobin 7 (previously 12.7 g/dL on 8/1) and sodium 130.  Stool guaiacs were noted to be positive.  Dr. Penelope Coop of GI was formally consulted.  The case was discussed with Dr. Trula Slade of vascular surgery who ordered ABIs and duplex studies.   [facility] called to admit.   Assessment & Plan:   Principal Problem:   GI bleed Active Problems:   PVD (peripheral vascular disease) (HCC)   Hyponatremia   Weight loss   Severe protein-calorie malnutrition (HCC)   Occlusion of common femoral artery (HCC)   GI bleed with acute blood loss anemia:  Patient found to have a hemoglobin of 7 g/dL with positive stool guaiacs.  Hemoglobin previously noted to be 12.7 on 8/1.  He reports prior history of polyps.  EGD/colonoscopy on 8/9 with large gastric ulcer, 1 polyp in the cecum, 2 polyps descending colon, one polyp hepatic flexure which were removed/biopsied. --[facility] following; appreciate assistance --s/p 2u pRBC on 8/7 --Hgb 7.1>9.9>10.3 --protonix 40mg  PO BID x 8 weeks, followed by once daily --soft diet --Repeat CBC in a.m.  Peripheral vascular disease:  Patient complained of left leg pain acute numbness.  Thought less likely to be secondary to stroke and more like disease.  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   ABI with critical limb ischemia.  Ultrasound duplex of vascular left lower extremity with total occlusion left common femoral artery and areas of occlusion mid to distal SFA. Lipid panel with total cholesterol 111, HDL 33, LDL 57, triglycerides 103. --Vascular surgery following, Dr. Trula Slade, appreciate assistance --Vascular surgery believes paresthesias to lower extremities due to a low flow state given his significant anemia and hypertension  and currently delaying further vascular work-up until anemia is stemming from GI source have resolved as he will require IV heparin for an angiogram. --Started atorvastatin 40 mg p.o. daily  Hyponatremia: Acute.   Sodium 130 on admission.  Suspect likely secondary to dehydration given elevated BUN to creatinine ratio. --Gentle IV fluids at 75 mL/h  --Na 001>749>449 --Continue to monitor sodium  level  Weight loss/underweight:  Severe protein calorie malnutrition BMI 14.99 kg/m.  Patient reports weight loss of over 30 pounds over the last month or so.  Last hemoglobin A1c 5 and TSH 2.408 from lab work 03/17/2020 on care everywhere. --consult dietitian   Tobacco use:  Patient stated he no longer smokes cigarettes, but does smoke Black & Mild every now and then.  Counseled on need for complete cessation given his peripheral vascular disease.   DVT prophylaxis: SCDs, chemical DVT prophylaxis contraindicated in acute GI bleed Code Status: Full code Family Communication: No family present at bedside this morning Disposition Plan:  Status is: Inpatient  Remains inpatient appropriate because:Hemodynamically unstable, Ongoing diagnostic testing needed not appropriate for outpatient work up, Unsafe d/c plan, IV treatments appropriate due to intensity of illness or inability to take PO and Inpatient level of care appropriate due to severity of illness GI bleed, left common femoral artery occlusion   Dispo: The patient is from: Home              Anticipated d/c is to: Home              Anticipated d/c date is: 1 day              Patient currently is not medically stable to d/c.   Consultants:   Vascular surgery, Dr. Wanita Chamberlain gastroenterology, Dr. Penelope Coop  Procedures:   ABI: Summary:  Right: Resting right ankle-brachial index indicates critical limb  ischemia.  Absent waveforms in the DP, PT and great toe.  Left: Resting left ankle-brachial index indicates critical left limb  ischemia.  Absent waveforms in the DP, PT and great toe.   Vascular duplex ultrasound lower extremity: Summary:  Left: 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 Areas of  occlusion noted in the mid to distal SFA with reconstitution in the distal  SFA. Unable to visualize ATA.   Antimicrobials:   none   Subjective: Patient seen and examined bedside, resting comfortably. Just  returned from endoscopy, still groggy from anesthesia with some fatigue. Hemoglobin appears stable. Several polyps which were removed during colonoscopy, large gastric ulcer noted on EGD. Patient without any other concerns or complaints this morning.  Denies headache, no fever/chills/night sweats, nausea/vomiting/diarrhea, no chest pain, no palpitations, no shortness of breath, no abdominal pain.  No acute events overnight per nursing staff.  Objective: Vitals:   04/11/20 1020 04/11/20 1030 04/11/20 1035 04/11/20 1227  BP: (!) 167/96 (!) 174/102 (!) 165/102 (!) 145/93  Pulse:  84 82 80  Resp: 13 16 16 16   Temp:    (!) 97.4 F (36.3 C)  TempSrc:    Oral  SpO2: 96% 98% 97% 100%  Weight:      Height:        Intake/Output Summary (Last 24 hours) at 04/11/2020 1411 Last data filed at 04/11/2020 1106 Gross per 24 hour  Intake 2309.27 ml  Output 250 ml  Net 2059.27 ml   Filed Weights   04/09/20 1022 04/10/20 0258  Weight: 43.4 kg 42.3 kg    Examination:  General exam: Appears  calm and comfortable, thin/cachectic, chronically ill in appearance Respiratory system: Clear to auscultation. Respiratory effort normal.  Oxygenating well on room air Cardiovascular system: S1 & S2 heard, RRR. No JVD, murmurs, rubs, gallops or clicks. No pedal edema. Gastrointestinal system: Abdomen is nondistended, soft and nontender. No organomegaly or masses felt. Normal bowel sounds heard. Central nervous system: Alert and oriented. No focal neurological deficits. Extremities: Symmetric 5 x 5 power. Skin: No rashes, lesions or ulcers Psychiatry: Judgement and insight appear poor. Mood & affect appropriate.     Data Reviewed: I have personally reviewed following labs and imaging studies  CBC: Recent Labs  Lab 04/09/20 0915 04/09/20 0917 04/10/20 1008 04/10/20 1803 04/11/20 0211  WBC 8.8  --  15.4*  --  21.4*  NEUTROABS 6.3  --   --   --   --   HGB 7.0* 7.1* 9.9* 9.9* 10.3*  HCT 20.8* 21.0* 29.3*  28.8* 30.7*  MCV 90.0  --  85.7  --  88.7  PLT 238  --  274  --  938   Basic Metabolic Panel: Recent Labs  Lab 04/09/20 0915 04/09/20 0917 04/10/20 1008 04/11/20 0211  NA 130* 134* 134* 135  K 3.5 3.6 3.6 3.3*  CL 97* 96* 101 103  CO2 20*  --  22 23  GLUCOSE 167* 167* 134* 118*  BUN 34* 35* 23 19  CREATININE 1.08 1.00 1.07 1.23  CALCIUM 8.4*  --  8.7* 8.8*   GFR: Estimated Creatinine Clearance: 32 mL/min (by C-G formula based on SCr of 1.23 mg/dL). Liver Function Tests: Recent Labs  Lab 04/09/20 0915  AST 15  ALT 10  ALKPHOS 44  BILITOT 0.7  PROT 5.2*  ALBUMIN 2.8*   No results for input(s): LIPASE, AMYLASE in the last 168 hours. No results for input(s): AMMONIA in the last 168 hours. Coagulation Profile: Recent Labs  Lab 04/09/20 0915  INR 1.1   Cardiac Enzymes: No results for input(s): CKTOTAL, CKMB, CKMBINDEX, TROPONINI in the last 168 hours. BNP (last 3 results) No results for input(s): PROBNP in the last 8760 hours. HbA1C: No results for input(s): HGBA1C in the last 72 hours. CBG: Recent Labs  Lab 04/09/20 0912  GLUCAP 169*   Lipid Profile: Recent Labs    04/10/20 1008  CHOL 111  HDL 33*  LDLCALC 57  TRIG 103  CHOLHDL 3.4   Thyroid Function Tests: No results for input(s): TSH, T4TOTAL, FREET4, T3FREE, THYROIDAB in the last 72 hours. Anemia Panel: No results for input(s): VITAMINB12, FOLATE, FERRITIN, TIBC, IRON, RETICCTPCT in the last 72 hours. Sepsis Labs: No results for input(s): PROCALCITON, LATICACIDVEN in the last 168 hours.  Recent Results (from the past 240 hour(s))  SARS Coronavirus 2 by RT PCR (hospital order, performed in [facility] hospital lab) Nasopharyngeal Nasopharyngeal Swab     Status: None   Collection Time: 04/09/20 12:47 PM   Specimen: Nasopharyngeal Swab  Result Value Ref Range Status   SARS Coronavirus 2 NEGATIVE NEGATIVE Final    Comment: (NOTE) SARS-CoV-2 target nucleic acids are NOT DETECTED.  The SARS-CoV-2  RNA is generally detectable in upper and lower respiratory specimens during the acute phase of infection. The lowest concentration of SARS-CoV-2 viral copies this assay can detect is 250 copies / mL. A negative result does not preclude SARS-CoV-2 infection and should not be used as the sole basis for treatment or other patient management decisions.  A negative result may occur with improper specimen collection / handling, submission of  specimen other than nasopharyngeal swab, presence of viral mutation(s) within the areas targeted by this assay, and inadequate number of viral copies (<250 copies / mL). A negative result must be combined with clinical observations, patient history, and epidemiological information.  Fact Sheet for Patients:   StrictlyIdeas.no  Fact Sheet for Healthcare Providers: BankingDealers.co.za  This test is not yet approved or  cleared by the [place] FDA and has been authorized for detection and/or diagnosis of SARS-CoV-2 by FDA under an Emergency Use Authorization (EUA).  This EUA will remain in effect (meaning this test can be used) for the duration of the COVID-19 declaration under Section 564(b)(1) of the Act, 21 U.S.C. section 360bbb-3(b)(1), unless the authorization is terminated or revoked sooner.  Performed at [facility], [place] [address], [place], [place] [address]          Radiology Studies: No results found.      Scheduled Meds: . atorvastatin  40 mg Oral Daily  . ferrous sulfate  325 mg Oral Daily  . pantoprazole (PROTONIX) IV  40 mg Intravenous Q12H  . sodium chloride flush  3 mL Intravenous Once  . sodium chloride flush  3 mL Intravenous Q12H   Continuous Infusions: . sodium chloride    . sodium chloride 75 mL/hr at 04/11/20 1114     LOS: 2 days    Time spent: 35 minutes spent on chart review, discussion with nursing staff, consultants, updating family and  interview/physical exam; more than 50% of that time was spent in counseling and/or coordination of care.    Miken Stecher J [place] DO [facility] Available via Epic secure chat 7am-7pm After these hours, please refer to coverage provider listed on [email or web address] 04/11/2020, 2:11 PM

## 2020-04-11 NOTE — Op Note (Signed)
[facility] Patient Name: Keith Morales Procedure Date : 04/11/2020 MRN: [id number] Attending MD: Otis Brace , MD Date of Birth: Jul 27, 1946 CSN: [id number] Age: 74 Admit Type: Inpatient Procedure:                Colonoscopy Indications:              Last colonoscopy: date unknown (unable to locate                            last colonoscopy report), Weight loss Providers:                Otis Brace, MD, Clyde Lundborg, RN, Laverda Sorenson, Technician, Tyna Jaksch Technician Referring MD:              Medicines:                Sedation Administered by an Anesthesia Professional Complications:            No immediate complications. Estimated Blood Loss:     Estimated blood loss was minimal. Procedure:                Pre-Anesthesia Assessment:                           - Prior to the procedure, a History and Physical                            was performed, and patient medications and                            allergies were reviewed. The patient's tolerance of                            previous anesthesia was also reviewed. The risks                            and benefits of the procedure and the sedation                            options and risks were discussed with the patient.                            All questions were answered, and informed consent                            was obtained. Prior Anticoagulants: The patient has                            taken no previous anticoagulant or antiplatelet                            agents. ASA Grade Assessment: III - A patient with  severe systemic disease. After reviewing the risks                            and benefits, the patient was deemed in                            satisfactory condition to undergo the procedure.                           - Prior to the procedure, a History and Physical                            was performed, and patient  medications and                            allergies were reviewed. The patient's tolerance of                            previous anesthesia was also reviewed. The risks                            and benefits of the procedure and the sedation                            options and risks were discussed with the patient.                            All questions were answered, and informed consent                            was obtained. Prior Anticoagulants: The patient has                            taken no previous anticoagulant or antiplatelet                            agents. ASA Grade Assessment: III - A patient with                            severe systemic disease. After reviewing the risks                            and benefits, the patient was deemed in                            satisfactory condition to undergo the procedure.                           After obtaining informed consent, the colonoscope                            was passed under direct vision. Throughout the  procedure, the patient's blood pressure, pulse, and                            oxygen saturations were monitored continuously. The                            PCF-H190DL ([id number] Olympus pediatric colonscope                            was introduced through the anus and advanced to the                            the terminal ileum, with identification of the                            appendiceal orifice and IC valve. The colonoscopy                            was performed without difficulty. The patient                            tolerated the procedure well. The quality of the                            bowel preparation was fair. Scope In: 9:30:43 AM Scope Out: 9:57:52 AM Scope Withdrawal Time: 0 hours 19 minutes 43 seconds  Total Procedure Duration: 0 hours 27 minutes 9 seconds  Findings:      The perianal and digital rectal examinations were normal.      The terminal  ileum appeared normal.      A 10 mm polyp was found in the cecum. The polyp was sessile. The polyp       was removed with a hot snare. Resection and retrieval were complete.      Two sessile polyps were found in the ascending colon. The polyps were 5       to 8 mm in size. These polyps were removed with a hot snare. Resection       and retrieval were complete.      A 5 mm polyp was found in the hepatic flexure. The polyp was sessile.       The polyp was removed with a hot snare. Resection and retrieval were       complete.      Multiple small and large-mouthed diverticula were found in the sigmoid       colon and descending colon.      A 3 mm polyp was found in the rectum. The polyp was sessile. The polyp       was removed with a cold snare. Resection and retrieval were complete.      Internal hemorrhoids were found during retroflexion. The hemorrhoids       were large.      A scattered area of mildly petechial mucosa was found in the rectum. Impression:               - Preparation of the colon was fair.                           -  The examined portion of the ileum was normal.                           - One 10 mm polyp in the cecum, removed with a hot                            snare. Resected and retrieved.                           - Two 5 to 8 mm polyps in the ascending colon,                            removed with a hot snare. Resected and retrieved.                           - One 5 mm polyp at the hepatic flexure, removed                            with a hot snare. Resected and retrieved.                           - Diverticulosis in the sigmoid colon and in the                            descending colon.                           - One 3 mm polyp in the rectum, removed with a cold                            snare. Resected and retrieved.                           - Internal hemorrhoids.                           - Petechial mucosa in the rectum. Recommendation:           -  Return patient to hospital ward for ongoing care.                           - Soft diet.                           - Continue present medications.                           - Await pathology results.                           - Repeat colonoscopy in 3 years for surveillance. Procedure Code(s):        --- Professional ---                           [phone], Colonoscopy, flexible; with removal  of                            tumor(s), polyp(s), or other lesion(s) by snare                            technique Diagnosis Code(s):        --- Professional ---                           K64.8, Other hemorrhoids                           K63.5, Polyp of colon                           K62.1, Rectal polyp                           R63.4, Abnormal weight loss                           K57.30, Diverticulosis of large intestine without                            perforation or abscess without bleeding CPT copyright 2019 American Medical Association. All rights reserved. The codes documented in this report are preliminary and upon coder review may  be revised to meet current compliance requirements. Otis Brace, MD Otis Brace, MD 04/11/2020 10:17:59 AM Number of Addenda: 0

## 2020-04-11 NOTE — Interval H&P Note (Signed)
History and Physical Interval Note:  04/11/2020 8:44 AM  Keith Morales  has presented today for surgery, with the diagnosis of melena, anemia, weight loss, history colon polyps.  The various methods of treatment have been discussed with the patient and family. After consideration of risks, benefits and other options for treatment, the patient has consented to  Procedure(s): ESOPHAGOGASTRODUODENOSCOPY (EGD) WITH PROPOFOL (N/A) COLONOSCOPY WITH PROPOFOL (N/A) as a surgical intervention.  The patient's history has been reviewed, patient examined, no change in status, stable for surgery.  I have reviewed the patient's chart and labs.  Questions were answered to the patient's satisfaction.     Ardelle Haliburton

## 2020-04-11 NOTE — Brief Op Note (Signed)
04/09/2020 - 04/11/2020  10:18 AM  PATIENT:  Keith Morales  74 y.o. male  PRE-OPERATIVE DIAGNOSIS:  melena, anemia, weight loss, history colon polyps  POST-OPERATIVE DIAGNOSIS:  EGD: hiatal hernia, gastric ulcer biopsy r/o cancer colonoscopy: hot snare cecal polyp, rectal polyp, hot snare ascending colon, hepatic flexure polyps, rectal polyp  PROCEDURE:  Procedure(s): ESOPHAGOGASTRODUODENOSCOPY (EGD) WITH PROPOFOL (N/A) COLONOSCOPY WITH PROPOFOL (N/A) BIOPSY POLYPECTOMY  SURGEON:  Surgeon(s) and Role:    * Yojan Paskett, MD - Primary  Findings ------------ -EGD showed large gastric ulcer and diffuse gastritis.  Biopsies performed. -Colonoscopy showed multiple polyps, diverticulosis, petechiae in the rectum and internal hemorrhoids.  Recommendations ----------------------- -Advance diet to soft -Recommend twice a day PPI for 8 weeks followed by once a day PPI 40 mg -Recommend repeat EGD in 2 to 3 months to document healing of large gastric ulcer -No further inpatient GI work-up planned.  GI will sign off.  Call us back if needed -Follow-up in GI clinic in 6 weeks after discharge  Otis Brace MD, [place] 04/11/2020, 10:21 AM  Contact #  [phone]

## 2020-04-12 ENCOUNTER — Inpatient Hospital Stay (HOSPITAL_COMMUNITY): Payer: Medicare Other

## 2020-04-12 ENCOUNTER — Encounter (HOSPITAL_COMMUNITY): Payer: Self-pay | Admitting: Gastroenterology

## 2020-04-12 LAB — CBC
HCT: 26.5 % — ABNORMAL LOW (ref 39.0–52.0)
Hemoglobin: 8.9 g/dL — ABNORMAL LOW (ref 13.0–17.0)
MCH: 30.2 pg (ref 26.0–34.0)
MCHC: 33.6 g/dL (ref 30.0–36.0)
MCV: 89.8 fL (ref 80.0–100.0)
Platelets: 267 10*3/uL (ref 150–400)
RBC: 2.95 MIL/uL — ABNORMAL LOW (ref 4.22–5.81)
RDW: 14.5 % (ref 11.5–15.5)
WBC: 20.7 10*3/uL — ABNORMAL HIGH (ref 4.0–10.5)
nRBC: 0.1 % (ref 0.0–0.2)

## 2020-04-12 LAB — BASIC METABOLIC PANEL
Anion gap: 10 (ref 5–15)
BUN: 21 mg/dL (ref 8–23)
CO2: 21 mmol/L — ABNORMAL LOW (ref 22–32)
Calcium: 8.4 mg/dL — ABNORMAL LOW (ref 8.9–10.3)
Chloride: 105 mmol/L (ref 98–111)
Creatinine, Ser: 1.94 mg/dL — ABNORMAL HIGH (ref 0.61–1.24)
GFR calc Af Amer: 39 mL/min — ABNORMAL LOW (ref >60)
GFR calc non Af Amer: 33 mL/min — ABNORMAL LOW (ref >60)
Glucose, Bld: 123 mg/dL — ABNORMAL HIGH (ref 70–99)
Potassium: 3.3 mmol/L — ABNORMAL LOW (ref 3.5–5.1)
Sodium: 136 mmol/L (ref 135–145)

## 2020-04-12 LAB — MAGNESIUM: Magnesium: 2 mg/dL (ref 1.7–2.4)

## 2020-04-12 LAB — SURGICAL PATHOLOGY

## 2020-04-12 MED ORDER — ENSURE ENLIVE PO LIQD
237.0000 mL | Freq: Two times a day (BID) | ORAL | Status: DC
  Administered 2020-04-12 – 2020-04-16 (×9): 237 mL via ORAL

## 2020-04-12 MED ORDER — PROSOURCE PLUS PO LIQD
30.0000 mL | Freq: Two times a day (BID) | ORAL | Status: DC
  Administered 2020-04-12 – 2020-04-16 (×9): 30 mL via ORAL
  Filled 2020-04-12 (×9): qty 30

## 2020-04-12 NOTE — Progress Notes (Signed)
Initial Nutrition Assessment  DOCUMENTATION CODES:   Severe malnutrition in context of acute illness/injury, Underweight  INTERVENTION:  Provide Ensure Enlive po BID, each supplement provides 350 kcal and 20 grams of protein.  Provide 30 ml Prosource plus po BID, each supplement provides 100 kcal and 15 grams of protein.   Encourage adequate PO intake.   Monitor magnesium, potassium, and phosphorus daily for at least 3 days, MD to replete as needed, as pt is at risk for refeeding syndrome given severe malnutrition and prolonged very poor po intake over the past 2 weeks.  NUTRITION DIAGNOSIS:   Severe Malnutrition related to acute illness (GI bleed) as evidenced by energy intake < or equal to 50% for > or equal to 5 days, percent weight loss.  GOAL:   Patient will meet greater than or equal to 90% of their needs  MONITOR:   PO intake, Supplement acceptance, Skin, Weight trends, Labs, I & O's  REASON FOR ASSESSMENT:   Consult Assessment of nutrition requirement/status  ASSESSMENT:   74 y.o. male with medical history significant of hypertension and COPD who presented with complaints of left leg numbness and pain. Over the past weeks to months he has been having abdominal pain, decreased appetite, and weight loss. Pt with GI bleed with acute blood loss anemia.  Pt underwent EGD and endoscopy yesterday. Pt with large gastric ulcer, 1 polyp in the cecum, 2 polyps descending colon, one polyp hepatic flexure which were removed/biopsied.  Pt reports appetite has been improving since admission. Pt does however report only eating a little bit of a time. Pt reports poor po PTA with consumption of only small amounts of juices and water over the past 2 weeks. Pt reports weight loss with usual body weight of ~135 lbs which he reports last weighing ~2 months ago. Pt with a reported 31% weight loss in 2 months, which is significant for time frame. Pt at risk for refeeding syndrome related to  malnutrition and prolonged poor po intake over the past 2 weeks. RD to order nutritional supplements to aid in caloric and protein needs. Unable to complete Nutrition-Focused physical exam at this time.   Labs and medications reviewed.   Diet Order:   Diet Order            DIET SOFT Room service appropriate? Yes; Fluid consistency: Thin  Diet effective now                 EDUCATION NEEDS:   Not appropriate for education at this time  Skin:  Skin Assessment: Reviewed RN Assessment  Last BM:  8/9  Height:   Ht Readings from Last 1 Encounters:  04/10/20 5\' 6"  (1.676 m)    Weight:   Wt Readings from Last 1 Encounters:  04/10/20 42.3 kg    Ideal Body Weight:  64.54 kg  BMI:  Body mass index is 15.05 kg/m.  Estimated Nutritional Needs:   Kcal:  6599-3570  Protein:  80-95 grams  Fluid:  >/= 1.8 L/day  Corrin Parker, MS, RD, LDN RD pager number/after hours weekend pager number on Amion.

## 2020-04-12 NOTE — TOC Initial Note (Signed)
Transition of Care [facility]) - Initial/Assessment Note    Patient Details  Name: Keith Morales MRN: [id number] Date of Birth: May 23, 1946  [facility] CM/SW Contact:    Pollie Friar, RN Phone Number: 04/12/2020, 3:12 PM  Clinical Narrative:                 Pt lives home with spouse and he does the driving.  Pt denies issues with home medications and states his spouse can provide supervision. PT currently recommending [place] services and pt is agreeable.  Pt asking for wheelchair for home. CM has sent message to PT. TOC following.  Expected Discharge Plan: [place] Barriers to Discharge: Continued Medical Work up   Patient Goals and CMS Choice   CMS Medicare.gov Compare Post Acute Care list provided to:: Patient Choice offered to / list presented to : Patient  Expected Discharge Plan and Services Expected Discharge Plan: [place]   Discharge Planning Services: CM Consult Post Acute Care Choice: [place] arrangements for the past 2 months: Single Family Home                                      Prior Living Arrangements/Services Living arrangements for the past 2 months: Single Family Home Lives with:: Spouse Patient language and need for interpreter reviewed:: Yes Do you feel safe going back to the place where you live?: Yes      Need for Family Participation in Patient Care: Yes (Comment) Care giver support system in place?: Yes (comment)   Criminal Activity/Legal Involvement Pertinent to Current Situation/Hospitalization: No - Comment as needed  Activities of Daily Living Home Assistive Devices/Equipment: None ADL Screening (condition at time of admission) Patient's cognitive ability adequate to safely complete daily activities?: Yes Is the patient deaf or have difficulty hearing?: No Does the patient have difficulty seeing, even when wearing glasses/contacts?: No Does the patient have difficulty  concentrating, remembering, or making decisions?: No Patient able to express need for assistance with ADLs?: Yes Does the patient have difficulty dressing or bathing?: No Independently performs ADLs?: No Communication: Independent Dressing (OT): Needs assistance Is this a change from baseline?: Pre-admission baseline Grooming: Needs assistance Is this a change from baseline?: Pre-admission baseline Feeding: Independent Bathing: Needs assistance Is this a change from baseline?: Pre-admission baseline Toileting: Independent In/Out Bed: Needs assistance Is this a change from baseline?: Pre-admission baseline Walks in Home: Independent Does the patient have difficulty walking or climbing stairs?: Yes Weakness of Legs: Left Weakness of Arms/Hands: None  Permission Sought/Granted                  Emotional Assessment Appearance:: Appears stated age Attitude/Demeanor/Rapport: Engaged Affect (typically observed): Accepting Orientation: : Oriented to Self, Oriented to Place, Oriented to  Time, Oriented to Situation   Psych Involvement: No (comment)  Admission diagnosis:  CVA (cerebral vascular accident) ([place]) [I63.9] Left leg pain [M79.605] Gastrointestinal hemorrhage, unspecified gastrointestinal hemorrhage type [K92.2] Anemia, unspecified type [D64.9] Patient Active Problem List   Diagnosis Date Noted  . Severe protein-calorie malnutrition ([place]) 04/10/2020  . 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 PVD (peripheral vascular disease) ([place]) 04/09/2020  . GI bleed 04/09/2020  . Hyponatremia 04/09/2020  . Weight loss 04/09/2020   PCP:  Miguel Aschoff, MD Pharmacy:   [id number] - [place], [address] [phone]  [place] [address] Phone: [phone] Fax: [phone]     Social Determinants of Health (SDOH) Interventions    Readmission Risk Interventions No flowsheet data found.

## 2020-04-12 NOTE — Progress Notes (Signed)
PROGRESS NOTE    Keith Morales  MVH:[id number] DOB: 1946/07/01 DOA: 04/09/2020 PCP: Miguel Aschoff, MD    Brief Narrative:  Keith Morales is a 74 y.o. male with medical history significant of hypertension and COPD who presented with complaints of left leg numbness and pain.  Over the last couple weeks to months he has been having abdominal pain, decreased appetite, and weight loss of at least 30 pounds.  He has only been able to drink liquids despite family's attempts.  Over the last week or so he has been having dark stools.  He last had 2 bowel movements yesterday.  Denies being on any blood thinners are taking any NSAIDs.  Knee pain on the left leg started acutely today and thereafter he developed numbness for which she was unable to get up and ambulate.  He reports that he last had a colonoscopy with Dr.Mann [facility] few years ago was noted to have a couple polyps which were removed. 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  He quit smoking cigarettes over 20 years ago, but intermittently smokes Black & Mild every now and then.  Patient was seen as a code stroke initially upon arrival into the emergency department due to his complaints.  CT angiogram of the head and neck was negative for any acute abnormalities.  Vital signs were noted to be stable.  After further evaluation symptoms were thought more likely vascular in nature and code stroke was canceled.  Labs significant for hemoglobin 7 (previously 12.7 g/dL on 8/1) and sodium 130.  Stool guaiacs were noted to be positive.  Dr. Penelope Coop of GI was formally consulted.  The case was discussed with Dr. Trula Slade of vascular surgery who ordered ABIs and duplex studies.   [facility] called to admit.   Assessment & Plan:   Principal Problem:   GI bleed Active Problems:   PVD (peripheral vascular disease) (HCC)   Hyponatremia   Weight loss   Severe protein-calorie malnutrition (HCC)   Occlusion of common femoral artery (HCC)   GI bleed with acute blood loss anemia:  Patient found to have a hemoglobin of 7 g/dL with positive stool guaiacs.  Hemoglobin previously noted to be 12.7 on 8/1.  He reports prior history of polyps.  EGD/colonoscopy on 8/9 with large gastric ulcer, 1 polyp in the cecum, 2 polyps descending colon, one polyp hepatic flexure which were removed/biopsied. --[facility] following; appreciate assistance --s/p 2u pRBC on 8/7 --Hgb 7.1>9.9>10.3>8.9 --protonix 40mg  PO BID x 8 weeks, followed by once daily --soft diet --Follow-up pathology from biopsies --Repeat CBC in a.m.  Peripheral vascular disease:  Patient complained of left leg pain acute numbness.  Thought less likely to be secondary to stroke and more like disease.  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   ABI with critical limb ischemia.  Ultrasound duplex of vascular left lower extremity with total occlusion left common femoral artery and areas of occlusion mid to distal SFA. Lipid panel with total cholesterol 111, HDL 33, LDL 57, triglycerides 103. --Vascular surgery following, Dr. Trula Slade, appreciate assistance --Vascular surgery believes paresthesias to lower extremities due to a low flow state given his  significant anemia and hypertension and currently delaying further vascular work-up until anemia is stemming from GI source have resolved as he will require IV heparin for an angiogram. --Started atorvastatin 40 mg p.o. daily  Hyponatremia: Acute.   Sodium 130 on admission.  Suspect likely secondary to dehydration given elevated BUN to creatinine ratio. --Gentle IV fluids at 75 mL/h  --Na  [phone] --Continue to monitor sodium level  Low back pain Patient complains of persistent, chronic low back pain.  He also describes episodes of unable to sensate when he urinates.  Although denies any saddle anesthesia. --MR L-spine without contrast: Pending  Weight loss/underweight:  Severe protein calorie malnutrition BMI 14.99 kg/m.  Patient reports weight loss of over 30 pounds over the last month or so.  Last hemoglobin A1c 5 and TSH 2.408 from lab work 03/17/2020 on care everywhere. --consult dietitian   Tobacco use:  Patient stated he no longer smokes cigarettes, but does smoke Black & Mild every now and then.  Counseled on need for complete cessation given his peripheral vascular disease.   DVT prophylaxis: SCDs, chemical DVT prophylaxis contraindicated in acute GI bleed Code Status: Full code Family Communication: No family present at bedside this morning Disposition Plan:  Status is: Inpatient  Remains inpatient appropriate because:Hemodynamically unstable, Ongoing diagnostic testing needed not appropriate for outpatient work up, Unsafe d/c plan, IV treatments appropriate due to intensity of illness or inability to take PO and Inpatient level of care appropriate due to severity of illness GI bleed, left common femoral artery occlusion   Dispo: The patient is from: Home              Anticipated d/c is to: Home              Anticipated d/c date is: 1 day              Patient currently is not medically stable to d/c.   Consultants:   Vascular surgery, Dr. Wanita Chamberlain gastroenterology, Dr. Penelope Coop  Procedures:   ABI: Summary:  Right: Resting right ankle-brachial index indicates critical limb  ischemia.  Absent waveforms in the DP, PT and great toe.  Left: Resting left ankle-brachial index indicates critical left limb  ischemia.  Absent waveforms in the DP, PT and great toe.   Vascular duplex ultrasound lower extremity: Summary:  Left: Total  occlusion noted in the common femoral artery. Areas of  occlusion noted in the mid to distal SFA with reconstitution in the distal  SFA. Unable to visualize ATA.   Antimicrobials:   none   Subjective: Patient seen and examined bedside, resting comfortably.  Feels fatigued and weak today.  Hemoglobin dropped from 10.3-8.9 this morning.  No further signs of bleeding.  Vascular surgery still holding on any type of intervention until he appears more stable in terms of his anemia (concerned about slight drop in hemoglobin this morning).  Patient also complaining of low back pain and times that he cannot since he is urinating which is been going on for some time.  Denies saddle anesthesia.  No other complaints or concerns this morning. Denies headache, no fever/chills/night sweats, nausea/vomiting/diarrhea, no chest pain, no palpitations, no shortness of breath, no abdominal pain.  No acute events overnight per nursing staff.  Objective: Vitals:   04/11/20 1826 04/12/20 0053 04/12/20 0551 04/12/20 1250  BP: 135/86 (!) 151/99 (!) 146/94 (!) 139/91  Pulse: 90 96 78 86  Resp: 16 18 16 18   Temp: 98.1 F (36.7 C)  99.1 F (37.3 C) 98.3 F (36.8 C) 98.5 F (36.9 C)  TempSrc: Oral Oral Oral Oral  SpO2: 98% 99% 99% 99%  Weight:      Height:        Intake/Output Summary (Last 24 hours) at 04/12/2020 1417 Last data filed at 04/12/2020 3500 Gross per 24 hour  Intake 1852.22 ml  Output 150 ml  Net 1702.22 ml   Filed Weights   04/09/20 1022 04/10/20 0258  Weight: 43.4 kg 42.3 kg    Examination:  General exam: Appears calm and comfortable, thin/cachectic, chronically ill in appearance Respiratory system: Clear to auscultation. Respiratory effort normal.  Oxygenating well on room air Cardiovascular system: S1 & S2 heard, RRR. No JVD, murmurs, rubs, gallops or clicks. No pedal edema. Gastrointestinal system: Abdomen is nondistended, soft and nontender. No organomegaly or masses felt. Normal  bowel sounds heard. Central nervous system: Alert and oriented. No focal neurological deficits. Extremities: Symmetric 5 x 5 power. Skin: No rashes, lesions or ulcers Psychiatry: Judgement and insight appear poor. Mood & affect appropriate.     Data Reviewed: I have personally reviewed following labs and imaging studies  CBC: Recent Labs  Lab 04/09/20 0915 04/09/20 0915 04/09/20 0917 04/10/20 1008 04/10/20 1803 04/11/20 0211 04/12/20 0312  WBC 8.8  --   --  15.4*  --  21.4* 20.7*  NEUTROABS 6.3  --   --   --   --   --   --   HGB 7.0*   < > 7.1* 9.9* 9.9* 10.3* 8.9*  HCT 20.8*   < > 21.0* 29.3* 28.8* 30.7* 26.5*  MCV 90.0  --   --  85.7  --  88.7 89.8  PLT 238  --   --  274  --  298 267   < > = values in this interval not displayed.   Basic Metabolic Panel: Recent Labs  Lab 04/09/20 0915 04/09/20 0917 04/10/20 1008 04/11/20 0211 04/12/20 0312  NA 130* 134* 134* 135 136  K 3.5 3.6 3.6 3.3* 3.3*  CL 97* 96* 101 103 105  CO2 20*  --  22 23 21*  GLUCOSE 167* 167* 134* 118* 123*  BUN 34* 35* 23 19 21   CREATININE 1.08 1.00 1.07 1.23 1.94*  CALCIUM 8.4*  --  8.7* 8.8* 8.4*  MG  --   --   --   --  2.0   GFR: Estimated Creatinine Clearance: 20.3 mL/min (A) (by C-G formula based on SCr of 1.94 mg/dL (H)). Liver Function Tests: Recent Labs  Lab 04/09/20 0915  AST 15  ALT 10  ALKPHOS 44  BILITOT 0.7  PROT 5.2*  ALBUMIN 2.8*   No results for input(s): LIPASE, AMYLASE in the last 168 hours. No results for input(s): AMMONIA in the last 168 hours. Coagulation Profile: Recent Labs  Lab 04/09/20 0915  INR 1.1   Cardiac Enzymes: No results for input(s): CKTOTAL, CKMB, CKMBINDEX, TROPONINI in the last 168 hours. BNP (last 3 results) No results for input(s): PROBNP in the last 8760 hours. HbA1C: No results for input(s): HGBA1C in the last 72 hours. CBG: Recent Labs  Lab 04/09/20 0912  GLUCAP 169*   Lipid Profile: Recent Labs    04/10/20 1008  CHOL 111   HDL 33*  LDLCALC 57  TRIG 103  CHOLHDL 3.4   Thyroid Function Tests: No results for input(s): TSH, T4TOTAL, FREET4, T3FREE, THYROIDAB in the last 72 hours. Anemia Panel: No results for input(s): VITAMINB12, FOLATE, FERRITIN, TIBC,  IRON, RETICCTPCT in the last 72 hours. Sepsis Labs: No results for input(s): PROCALCITON, LATICACIDVEN in the last 168 hours.  Recent Results (from the past 240 hour(s))  SARS Coronavirus 2 by RT PCR (hospital order, performed in [facility] hospital lab) Nasopharyngeal Nasopharyngeal Swab     Status: None   Collection Time: 04/09/20 12:47 PM   Specimen: Nasopharyngeal Swab  Result Value Ref Range Status   SARS Coronavirus 2 NEGATIVE NEGATIVE Final    Comment: (NOTE) SARS-CoV-2 target nucleic acids are NOT DETECTED.  The SARS-CoV-2 RNA is generally detectable in upper and lower respiratory specimens during the acute phase of infection. The lowest concentration of SARS-CoV-2 viral copies this assay can detect is 250 copies / mL. A negative result does not preclude SARS-CoV-2 infection and should not be used as the sole basis for treatment or other patient management decisions.  A negative result may occur with improper specimen collection / handling, submission of specimen other than nasopharyngeal swab, presence of viral mutation(s) within the areas targeted by this assay, and inadequate number of viral copies (<250 copies / mL). A negative result must be combined with clinical observations, patient history, and epidemiological information.  Fact Sheet for Patients:   StrictlyIdeas.no  Fact Sheet for Healthcare Providers: BankingDealers.co.za  This test is not yet approved or  cleared by the [place] FDA and has been authorized for detection and/or diagnosis of SARS-CoV-2 by FDA under an Emergency Use Authorization (EUA).  This EUA will remain in effect (meaning this test can be used) for the  duration of the COVID-19 declaration under Section 564(b)(1) of the Act, 21 U.S.C. section 360bbb-3(b)(1), unless the authorization is terminated or revoked sooner.  Performed at [facility], [place] [address], [place], [place] [address]          Radiology Studies: No results found.      Scheduled Meds: . atorvastatin  40 mg Oral Daily  . ferrous sulfate  325 mg Oral Daily  . pantoprazole (PROTONIX) IV  40 mg Intravenous Q12H  . sodium chloride flush  3 mL Intravenous Once  . sodium chloride flush  3 mL Intravenous Q12H   Continuous Infusions: . sodium chloride       LOS: 3 days    Time spent: 35 minutes spent on chart review, discussion with nursing staff, consultants, updating family and interview/physical exam; more than 50% of that time was spent in counseling and/or coordination of care.    Alyric Parkin J [place] DO [facility] Available via Epic secure chat 7am-7pm After these hours, please refer to coverage provider listed on [email or web address] 04/12/2020, 2:17 PM

## 2020-04-12 NOTE — Evaluation (Signed)
Physical Therapy Evaluation Patient Details Name: Keith Morales MRN: [id number] DOB: [birth year] Today's Date: 04/12/2020   History of Present Illness  Pt is 74 yo male who presents to the hospital with L Leg pain/numbness. Stroke was ruled out with imaging. Leg pain and numbess is due to occlusion of L femoral artery from PVD. Pt was also found to have a GI bleed and associated anemia. Pt had EGD, biopsy, polypectomy, and colonscopy done on 8/9. Pt has PMH of HTN, COPD, and PVD.     Clinical Impression  Pt was evaluated for the diagnosis above and the deficits noted below. Pt required supervision to min guard assist for all mobility tasks with RW. Noted to have incontinence upon standing. Pt reports that this is a new issue; RN notified. Pt lives with his wife at home who can assist him 24/7. Recommend home health PT vs no PT, pending his progression. Pt would continue to benefit from acute therapy services in order to improve functional strength and capacity. Will continue to follow acutely.     Follow Up Recommendations Home health PT;Other (comment) (vs no PT follow up pending progression )    Equipment Recommendations  Rolling walker with 5" wheels    Recommendations for Other Services       Precautions / Restrictions Precautions Precautions: Fall Restrictions Weight Bearing Restrictions: No      Mobility  Bed Mobility Overal bed mobility: Needs Assistance Bed Mobility: Supine to Sit     Supine to sit: Supervision;HOB elevated     General bed mobility comments: pt required supervision for safety with supine to sit   Transfers Overall transfer level: Needs assistance Equipment used: Rolling walker (2 wheeled) Transfers: Sit to/from [facility] Sit to Stand: Min guard Stand pivot transfers: Min guard       General transfer comment: pt required min gaurd assist for safety with RW for sit <>Stand transfer. pt required verbal cues for RW hand placement  with transfer. Pt with incontinence upon standing and reports he did not realize he was voiding. Required min guard A to pivot to chair using RW. Pt lunch in room, so further mobility deferred.   Ambulation/Gait                Stairs            Wheelchair Mobility    Modified Rankin (Stroke Patients Only)       Balance Overall balance assessment: Needs assistance Sitting-balance support: Feet supported;No upper extremity supported Sitting balance-Leahy Scale: Fair Sitting balance - Comments: pt able to maintain sitting balance at EOB withiout UE support   Standing balance support: No upper extremity supported;During functional activity;Bilateral upper extremity supported Standing balance-Leahy Scale: Fair Standing balance comment: pt was able to maintain standing balance without UE support on RW            Pertinent Vitals/Pain Pain Assessment: 0-10 Pain Score: 5  Pain Location: abdominal Pain Descriptors / Indicators: Constant;Discomfort;Operative site guarding Pain Intervention(s): Limited activity within patient's tolerance;Monitored during session    [place] expects to be discharged to:: Private residence Living Arrangements: Spouse/significant other Available Help at Discharge: Family Type of Home: House Home Access: Stairs to enter Entrance Stairs-Rails: Left Entrance Stairs-Number of Steps: 4 Home Layout: Able to live on main level with bedroom/bathroom;Two level Home Equipment: None      Prior Function Level of Independence: Needs assistance   Gait / Transfers Assistance Needed: Was independent with ambulation  ADL's /  Homemaking Assistance Needed: Reports he required bathing assist from his wife.        Hand Dominance        Extremity/Trunk Assessment   Upper Extremity Assessment Upper Extremity Assessment: Generalized weakness    Lower Extremity Assessment Lower Extremity Assessment: Generalized weakness  (notable atrophy in BLE )    Cervical / Trunk Assessment Cervical / Trunk Assessment: Normal  Communication   Communication: No difficulties  Cognition Arousal/Alertness: Awake/alert Behavior During Therapy: WFL for tasks assessed/performed Overall Cognitive Status: Within Functional Limits for tasks assessed             General Comments      Exercises     Assessment/Plan    PT Assessment Patient needs continued PT services  PT Problem List Decreased strength;Decreased range of motion;Decreased activity tolerance;Decreased mobility;Decreased balance;Decreased coordination;Pain       PT Treatment Interventions DME instruction;Gait training;Stair training;Therapeutic activities;Functional mobility training;Therapeutic exercise;Balance training;Neuromuscular re-education;Patient/family education    PT Goals (Current goals can be found in the Care Plan section)  Acute Rehab PT Goals Patient Stated Goal: be pain free to get home PT Goal Formulation: With patient Time For Goal Achievement: 04/26/20 Potential to Achieve Goals: Fair    Frequency Min 3X/week   Barriers to discharge        Co-evaluation               AM-PAC PT "6 Clicks" Mobility  Outcome Measure Help needed turning from your back to your side while in a flat bed without using bedrails?: None Help needed moving from lying on your back to sitting on the side of a flat bed without using bedrails?: None Help needed moving to and from a bed to a chair (including a wheelchair)?: A Little Help needed standing up from a chair using your arms (e.g., wheelchair or bedside chair)?: A Little Help needed to walk in hospital room?: A Little Help needed climbing 3-5 steps with a railing? : A Little 6 Click Score: 20    End of Session Equipment Utilized During Treatment: Gait belt Activity Tolerance: Patient tolerated treatment well Patient left: in chair;with call Veleda Mun/phone within reach;with chair alarm  set Nurse Communication: Mobility status (pt with incontinence) PT Visit Diagnosis: Muscle weakness (generalized) (M62.81);Pain Pain - part of body:  (stomach)    Time: 7494-4967 PT Time Calculation (min) (ACUTE ONLY): 20 min   Charges:   PT Evaluation $PT Eval Low Complexity: 1 Low         Gloriann Loan, SPT  Acute Rehabilitation Services  Office: [phone]  04/12/2020, 2:52 PM

## 2020-04-12 NOTE — Progress Notes (Addendum)
[facility]  Subjective  - States left leg feels fine and back to baseline without rest pain.   Objective (!) 146/94 78 98.3 F (36.8 C) (Oral) 16 99%  Intake/Output Summary (Last 24 hours) at 04/12/2020 1212 Last data filed at 04/12/2020 0102 Gross per 24 hour  Intake 1852.22 ml  Output 150 ml  Net 1702.22 ml    Left LE with motor, sensation and warmth to touch Lungs non labored breathing  Heart RRR  Assessment/Planning: Known left external iliac occlusion who came in with Anemia after blood loss likely due to gastric ulcer found on GI workup.  He received 2 units PRBC and now has a HGB of 8.9 down from 10.3 04/12/20.    We will wait for him to stabilize before further vascular work up in the form of angiogram with the need for Heparin dosing.  He is at baseline with his left leg.  He denise pain at rest, numbness or loss of motor.   Roxy Horseman 04/12/2020 12:12 PM --  Laboratory Lab Results: Recent Labs    04/11/20 0211 04/12/20 0312  WBC 21.4* 20.7*  HGB 10.3* 8.9*  HCT 30.7* 26.5*  PLT 298 267   BMET Recent Labs    04/11/20 0211 04/12/20 0312  NA 135 136  K 3.3* 3.3*  CL 103 105  CO2 23 21*  GLUCOSE 118* 123*  BUN 19 21  CREATININE 1.23 1.94*  CALCIUM 8.8* 8.4*    COAG Lab Results  Component Value Date   INR 1.1 04/09/2020   No results found for: PTT  I agree with the above.  The patient does not have any leg pain at this time.  No vascular intervention is recommended at this time until his GI bleeding issues have resolved.  This is because he would need IV heparin and potentially dual antiplatelet therapy following a intervention.  Annamarie Major

## 2020-04-13 DIAGNOSIS — R338 Other retention of urine: Secondary | ICD-10-CM

## 2020-04-13 DIAGNOSIS — E43 Unspecified severe protein-calorie malnutrition: Secondary | ICD-10-CM

## 2020-04-13 LAB — BASIC METABOLIC PANEL
Anion gap: 10 (ref 5–15)
BUN: 25 mg/dL — ABNORMAL HIGH (ref 8–23)
CO2: 19 mmol/L — ABNORMAL LOW (ref 22–32)
Calcium: 8.5 mg/dL — ABNORMAL LOW (ref 8.9–10.3)
Chloride: 106 mmol/L (ref 98–111)
Creatinine, Ser: 2.49 mg/dL — ABNORMAL HIGH (ref 0.61–1.24)
GFR calc Af Amer: 29 mL/min — ABNORMAL LOW (ref >60)
GFR calc non Af Amer: 25 mL/min — ABNORMAL LOW (ref >60)
Glucose, Bld: 155 mg/dL — ABNORMAL HIGH (ref 70–99)
Potassium: 3.1 mmol/L — ABNORMAL LOW (ref 3.5–5.1)
Sodium: 135 mmol/L (ref 135–145)

## 2020-04-13 LAB — CBC
HCT: 27.3 % — ABNORMAL LOW (ref 39.0–52.0)
Hemoglobin: 9.1 g/dL — ABNORMAL LOW (ref 13.0–17.0)
MCH: 29.8 pg (ref 26.0–34.0)
MCHC: 33.3 g/dL (ref 30.0–36.0)
MCV: 89.5 fL (ref 80.0–100.0)
Platelets: 275 10*3/uL (ref 150–400)
RBC: 3.05 MIL/uL — ABNORMAL LOW (ref 4.22–5.81)
RDW: 14.8 % (ref 11.5–15.5)
WBC: 20.9 10*3/uL — ABNORMAL HIGH (ref 4.0–10.5)
nRBC: 0.1 % (ref 0.0–0.2)

## 2020-04-13 MED ORDER — PANTOPRAZOLE SODIUM 40 MG PO TBEC
40.0000 mg | DELAYED_RELEASE_TABLET | Freq: Two times a day (BID) | ORAL | Status: DC
  Administered 2020-04-13 – 2020-04-16 (×6): 40 mg via ORAL
  Filled 2020-04-13 (×6): qty 1

## 2020-04-13 MED ORDER — HYDRALAZINE HCL 25 MG PO TABS
25.0000 mg | ORAL_TABLET | Freq: Four times a day (QID) | ORAL | Status: DC | PRN
  Administered 2020-04-13: 25 mg via ORAL
  Filled 2020-04-13: qty 1

## 2020-04-13 MED ORDER — BISACODYL 10 MG RE SUPP
10.0000 mg | Freq: Every day | RECTAL | Status: DC | PRN
  Administered 2020-04-13: 10 mg via RECTAL
  Filled 2020-04-13: qty 1

## 2020-04-13 MED ORDER — SENNOSIDES-DOCUSATE SODIUM 8.6-50 MG PO TABS
1.0000 | ORAL_TABLET | Freq: Once | ORAL | Status: AC
  Administered 2020-04-13: 1 via ORAL
  Filled 2020-04-13: qty 1

## 2020-04-13 MED ORDER — TAMSULOSIN HCL 0.4 MG PO CAPS
0.4000 mg | ORAL_CAPSULE | Freq: Every day | ORAL | Status: DC
  Administered 2020-04-13 – 2020-04-16 (×4): 0.4 mg via ORAL
  Filled 2020-04-13 (×4): qty 1

## 2020-04-13 MED ORDER — CHLORHEXIDINE GLUCONATE CLOTH 2 % EX PADS
6.0000 | MEDICATED_PAD | Freq: Every day | CUTANEOUS | Status: DC
  Administered 2020-04-13 – 2020-04-16 (×4): 6 via TOPICAL

## 2020-04-13 MED ORDER — POTASSIUM CHLORIDE CRYS ER 20 MEQ PO TBCR
40.0000 meq | EXTENDED_RELEASE_TABLET | Freq: Once | ORAL | Status: AC
  Administered 2020-04-13: 40 meq via ORAL
  Filled 2020-04-13: qty 2

## 2020-04-13 NOTE — Progress Notes (Signed)
Progress Note    [name]  NFA:[id number] DOB: [birth year]  DOA: 04/09/2020 PCP: Miguel Aschoff, MD    Brief Narrative:     Medical records reviewed and are as summarized below:  Keith Morales is an 74 y.o. male was seen as a code stroke initially upon arrival into the emergency department due to his complaints. CT angiogram of the head and neck was negative for any acute abnormalities. Vital signs were noted to be stable. After further evaluation symptoms were thought more likely vascular in nature and code stroke was canceled. Labs significant for hemoglobin 7 (previously 12.7 g/dL on 8/1)and sodium 130. Stool guaiacs were noted to be positive. Dr. Holland Falling was formally consulted. The case was discussed with Dr. Trula Slade of vascular surgery who ordered ABIs and duplex studies. [facility] called to admit.  Assessment/Plan:   Principal Problem:   GI bleed Active Problems:   PVD (peripheral vascular disease) (HCC)   Hyponatremia   Weight loss   Severe protein-calorie malnutrition (HCC)   Occlusion of common femoral artery (HCC)    GI bleedwith acute blood loss anemia:  Patient found to have a hemoglobin of 7 g/dL with positive stool guaiacs.Hemoglobin previously noted to be 12.7 on 8/1. He reports prior history of polyps. EGD/colonoscopy on 8/9 with large gastric ulcer, 1 polyp in the cecum, 2 polyps descending colon, one polyp hepatic flexure which were removed/biopsied. --[facility] following; appreciate assistance --s/p 2u pRBC on 8/7 --Hgb improved --protonix 40mg  PO BID x 8 weeks, followed by once daily --soft diet --Follow-up pathology from biopsies -trend CBC  Peripheral vascular disease:  Patientcomplained of left leg pain acute numbness initially but this has now resolved Thought less likely to be secondary to stroke and more like disease. Noted during CT scan to have chronicocclusion of the left common femoral artery with reconstitution of flow on  previous CT.  ABI with critical limb ischemia.  Ultrasound duplex of vascular left lower extremity with total occlusion left common femoral artery and areas of occlusion mid to distal SFA. Lipid panel with total cholesterol 111, HDL 33, LDL 57, triglycerides 103. --Vascular surgery following, Dr. Trula Slade, appreciate assistance --Vascular surgery believes paresthesias to lower extremities due to a low flow state given his significant anemia and hypertension and currently delaying further vascular work-up until anemia is stemming from GI source have resolved as he will require IV heparin for an angiogram. --Started atorvastatin 40 mg p.o. daily  Hyponatremia: Acute.  -resolved  Hypokalemia -replete -Mg normal  Leukocytosis -not clear source -no fever  Low back pain Patient complains of persistent, chronic low back pain.   -denies sensation changes -MRI:  1.Multilevel spondylosis. Asymmetric L4-5 disc bulge with abutment of the right greater than left exiting L4 nerve roots. Superimposed small central protrusion abuts the descending right S1 nerve root. 2. Mild to moderate L3-4 spinal canal and mild bilateral neural foraminal narrowing. 3. Mild right L2-3 and L4-5 neural foraminal narrowing.  Weight loss/underweight: Severe protein calorie malnutrition BMI 14.99 kg/m.Patient reports weight loss of over 30 pounds over the last month or so. Last hemoglobin A1c 5 and TSH 2.408 from lab work 03/17/2020 on care everywhere. -outpatient follow up  Tobacco use:  Patient stated he no longer smokes cigarettes, but does smokeBlack& Mildevery now and then.  Counseled on need for complete cessation given his peripheral vascular disease.  Acute urinary retention -enlarged prostate -foley placed due to >900 on bladder scan -will need urology outpatient follow up -suspect  elevated CR caused by hydronephrosis  Family Communication/Anticipated D/C date and plan/Code Status   DVT  prophylaxis: scd Code Status: Full Code.  Disposition Plan: Status is: Inpatient  Remains inpatient appropriate because:Inpatient level of care appropriate due to severity of illness   Dispo: The patient is from: Home              Anticipated d/c is to: Home              Anticipated d/c date is: 3 days              Patient currently is not medically stable to d/c.         Medical Consultants:    GI  vascular    Subjective:   No numbness or pain down legs  Objective:    Vitals:   04/12/20 2347 04/13/20 0500 04/13/20 0858 04/13/20 1217  BP: (!) 158/102 (!) 160/104 (!) 159/111 (!) 183/113  Pulse: 94 96 82 92  Resp: 18 18 16 16   Temp: 98.5 F (36.9 C) 98.6 F (37 C) 98.2 F (36.8 C) 98.5 F (36.9 C)  TempSrc: Oral Oral Oral Oral  SpO2: 97% 97% 98% 97%  Weight:      Height:        Intake/Output Summary (Last 24 hours) at 04/13/2020 1220 Last data filed at 04/13/2020 0509 Gross per 24 hour  Intake 240 ml  Output 500 ml  Net -260 ml   Filed Weights   04/09/20 1022 04/10/20 0258  Weight: 43.4 kg 42.3 kg    Exam:  General: Appearance:    Thin male in no acute distress     Lungs:      respirations unlabored  Heart:    Normal heart rate. Normal rhythm. No murmurs, rubs, or gallops.   MS:   All extremities are intact.   Neurologic:   Awake, alert, oriented x 3. No apparent focal neurological           defect.     Data Reviewed:   I have personally reviewed following labs and imaging studies:  Labs: Labs show the following:   Basic Metabolic Panel: Recent Labs  Lab 04/09/20 0915 04/09/20 0915 04/09/20 0917 04/09/20 0917 04/10/20 1008 04/10/20 1008 04/11/20 0211 04/11/20 0211 04/12/20 0312 04/13/20 0753  NA 130*   < > 134*  --  134*  --  135  --  136 135  K 3.5   < > 3.6   < > 3.6   < > 3.3*   < > 3.3* 3.1*  CL 97*   < > 96*  --  101  --  103  --  105 106  CO2 20*  --   --   --  22  --  23  --  21* 19*  GLUCOSE 167*   < > 167*  --  134*   --  118*  --  123* 155*  BUN 34*   < > 35*  --  23  --  19  --  21 25*  CREATININE 1.08   < > 1.00  --  1.07  --  1.23  --  1.94* 2.49*  CALCIUM 8.4*  --   --   --  8.7*  --  8.8*  --  8.4* 8.5*  MG  --   --   --   --   --   --   --   --  2.0  --    < > =  values in this interval not displayed.   GFR Estimated Creatinine Clearance: 15.8 mL/min (A) (by C-G formula based on SCr of 2.49 mg/dL (H)). Liver Function Tests: Recent Labs  Lab 04/09/20 0915  AST 15  ALT 10  ALKPHOS 44  BILITOT 0.7  PROT 5.2*  ALBUMIN 2.8*   No results for input(s): LIPASE, AMYLASE in the last 168 hours. No results for input(s): AMMONIA in the last 168 hours. Coagulation profile Recent Labs  Lab 04/09/20 0915  INR 1.1    CBC: Recent Labs  Lab 04/09/20 0915 04/09/20 0917 04/10/20 1008 04/10/20 1803 04/11/20 0211 04/12/20 0312 04/13/20 0753  WBC 8.8  --  15.4*  --  21.4* 20.7* 20.9*  NEUTROABS 6.3  --   --   --   --   --   --   HGB 7.0*   < > 9.9* 9.9* 10.3* 8.9* 9.1*  HCT 20.8*   < > 29.3* 28.8* 30.7* 26.5* 27.3*  MCV 90.0  --  85.7  --  88.7 89.8 89.5  PLT 238  --  274  --  298 267 275   < > = values in this interval not displayed.   Cardiac Enzymes: No results for input(s): CKTOTAL, CKMB, CKMBINDEX, TROPONINI in the last 168 hours. BNP (last 3 results) No results for input(s): PROBNP in the last 8760 hours. CBG: Recent Labs  Lab 04/09/20 0912  GLUCAP 169*   D-Dimer: No results for input(s): DDIMER in the last 72 hours. Hgb A1c: No results for input(s): HGBA1C in the last 72 hours. Lipid Profile: No results for input(s): CHOL, HDL, LDLCALC, TRIG, CHOLHDL, LDLDIRECT in the last 72 hours. Thyroid function studies: No results for input(s): TSH, T4TOTAL, T3FREE, THYROIDAB in the last 72 hours.  Invalid input(s): FREET3 Anemia work up: No results for input(s): VITAMINB12, FOLATE, FERRITIN, TIBC, IRON, RETICCTPCT in the last 72 hours. Sepsis Labs: Recent Labs  Lab 04/10/20 1008  04/11/20 0211 04/12/20 0312 04/13/20 0753  WBC 15.4* 21.4* 20.7* 20.9*    Microbiology Recent Results (from the past 240 hour(s))  SARS Coronavirus 2 by RT PCR (hospital order, performed in [facility] hospital lab) Nasopharyngeal Nasopharyngeal Swab     Status: None   Collection Time: 04/09/20 12:47 PM   Specimen: Nasopharyngeal Swab  Result Value Ref Range Status   SARS Coronavirus 2 NEGATIVE NEGATIVE Final    Comment: (NOTE) SARS-CoV-2 target nucleic acids are NOT DETECTED.  The SARS-CoV-2 RNA is generally detectable in upper and lower respiratory specimens during the acute phase of infection. The lowest concentration of SARS-CoV-2 viral copies this assay can detect is 250 copies / mL. A negative result does not preclude SARS-CoV-2 infection and should not be used as the sole basis for treatment or other patient management decisions.  A negative result may occur with improper specimen collection / handling, submission of specimen other than nasopharyngeal swab, presence of viral mutation(s) within the areas targeted by this assay, and inadequate number of viral copies (<250 copies / mL). A negative result must be combined with clinical observations, patient history, and epidemiological information.  Fact Sheet for Patients:   StrictlyIdeas.no  Fact Sheet for Healthcare Providers: BankingDealers.co.za  This test is not yet approved or  cleared by the [place] FDA and has been authorized for detection and/or diagnosis of SARS-CoV-2 by FDA under an Emergency Use Authorization (EUA).  This EUA will remain in effect (meaning this test can be used) for the duration of the COVID-19 declaration under  Section 564(b)(1) of the Act, 21 U.S.C. section 360bbb-3(b)(1), unless the authorization is terminated or revoked sooner.  Performed at [facility], [place] [address], [place], [place] [address]     Procedures and  diagnostic studies:  MR LUMBAR SPINE WO CONTRAST  Result Date: 04/12/2020 CLINICAL DATA:  Low back pain. EXAM: MRI LUMBAR SPINE WITHOUT CONTRAST TECHNIQUE: Multiplanar, multisequence MR imaging of the lumbar spine was performed. No intravenous contrast was administered. COMPARISON:  03/21/2020 CT abdomen pelvis. FINDINGS: Please note that motion artifact limits evaluation. Segmentation: L5 sacralization. There are 4 lumbar type non rib-bearing vertebral bodies. Alignment: Minimal grade 1 T12-L1 retrolisthesis. Grade 1 L3-4 anterolisthesis. Vertebrae: Diffuse bone marrow heterogeneity. Scattered T1/T2 hyperintense foci reflect hemangiomata versus focal fat. Conus medullaris and cauda equina: Conus extends to the L1 level. Conus and cauda equina appear normal. Disc levels: Multilevel desiccation and disc space loss most prominent at the T12-L1 and L4-5 levels. L1-2: Disc bulge, mild ligamentum flavum and bilateral facet hypertrophy. Partial effacement of the ventral CSF containing spaces. No significant neural foraminal narrowing. L2-3: Mild disc bulge, ligamentum flavum hypertrophy and bilateral facet degenerative spurring. Mild right neural foraminal narrowing. No significant spinal canal or left neural foraminal narrowing. L3-4: Disc bulge with superimposed central protrusion, ligamentum flavum and bilateral facet hypertrophy. There is also left foraminal annular fissuring. Mild to moderate spinal canal and mild bilateral neural foraminal narrowing. Trace bilateral facet joint effusions. L4-5: Disc bulge demonstrating asymmetric right extraforaminal/foraminal predominance with abutment of the exiting right L4 nerve root (8:30). There is minimal abutment of the left exiting L4 nerve root. Tiny superimposed central protrusion abutting the descending right S1 nerve root. Bilateral facet hypertrophy. Partial effacement of the lateral recesses. Mild right neural foraminal narrowing. No significant left neural  foraminal narrowing. L5-S1: No significant disc bulge, spinal canal or neural foraminal narrowing. Paraspinal and other soft tissues: Bilateral renal cysts. Bilateral pelviectasis with mild hydroureter. Partially imaged distended urinary bladder with dome approaching the umbilicus. Small hiatal hernia. IMPRESSION: 1. Transitional lumbosacral anatomy with L5 sacralization. 2. Multilevel spondylosis. Asymmetric L4-5 disc bulge with abutment of the right greater than left exiting L4 nerve roots. Superimposed small central protrusion abuts the descending right S1 nerve root. 3. Mild to moderate L3-4 spinal canal and mild bilateral neural foraminal narrowing. 4. Mild right L2-3 and L4-5 neural foraminal narrowing. 5. Distended urinary bladder with mild bilateral hydroureteronephrosis. Query partial bladder outlet obstruction, likely secondary to prostatomegaly. Electronically Signed   By: Primitivo Gauze M.D.   On: 04/12/2020 16:52    Medications:   . (feeding supplement) PROSource Plus  30 mL Oral BID BM  . atorvastatin  40 mg Oral Daily  . feeding supplement (ENSURE ENLIVE)  237 mL Oral BID BM  . ferrous sulfate  325 mg Oral Daily  . pantoprazole  40 mg Oral BID  . sodium chloride flush  3 mL Intravenous Once  . sodium chloride flush  3 mL Intravenous Q12H  . tamsulosin  0.4 mg Oral Daily   Continuous Infusions: . sodium chloride       LOS: 4 days   Geradine Girt  [facility]   How to contact the [facility] Attending or Consulting provider [place] or covering provider during after hours [place], for this patient?  1. Check the care team in [facility] and look for a) attending/consulting [facility] provider listed and b) the [facility] team listed 2. Log into [email or web address] and use [place]'s universal password to access.  If you do not have the password, please contact the hospital operator. 3. Locate the [facility] provider you are looking for under [facility] and page to a number that you can be directly  reached. 4. If you still have difficulty reaching the provider, please page the [facility] (Director on Call) for the Hospitalists listed on amion for assistance.  04/13/2020, 12:20 PM

## 2020-04-13 NOTE — Progress Notes (Signed)
  Progress Note    04/13/2020 11:46 AM 2 Days Post-Op  Subjective:  No complaints. Denies any pain in left or right lower extremities. No weakness or numbness   Vitals:   04/13/20 0500 04/13/20 0858  BP: (!) 160/104 (!) 159/111  Pulse: 96 82  Resp: 18 16  Temp: 98.6 F (37 C) 98.2 F (36.8 C)  SpO2: 97% 98%   Physical Exam: Cardiac:  regular Lungs: non labored Extremities:  Bilateral lower extremities well perfused and warm. Motor and sensation intact Neurologic: alert and oriented  CBC    Component Value Date/Time   WBC 20.9 (H) 04/13/2020 0753   RBC 3.05 (L) 04/13/2020 0753   HGB 9.1 (L) 04/13/2020 0753   HCT 27.3 (L) 04/13/2020 0753   PLT 275 04/13/2020 0753   MCV 89.5 04/13/2020 0753   MCH 29.8 04/13/2020 0753   MCHC 33.3 04/13/2020 0753   RDW 14.8 04/13/2020 0753   LYMPHSABS 1.5 04/09/2020 0915   MONOABS 0.9 04/09/2020 0915   EOSABS 0.0 04/09/2020 0915   BASOSABS 0.0 04/09/2020 0915    BMET    Component Value Date/Time   NA 135 04/13/2020 0753   K 3.1 (L) 04/13/2020 0753   CL 106 04/13/2020 0753   CO2 19 (L) 04/13/2020 0753   GLUCOSE 155 (H) 04/13/2020 0753   BUN 25 (H) 04/13/2020 0753   CREATININE 2.49 (H) 04/13/2020 0753   CALCIUM 8.5 (L) 04/13/2020 0753   GFRNONAA 25 (L) 04/13/2020 0753   GFRAA 29 (L) 04/13/2020 0753    INR    Component Value Date/Time   INR 1.1 04/09/2020 0915     Intake/Output Summary (Last 24 hours) at 04/13/2020 1146 Last data filed at 04/13/2020 0509 Gross per 24 hour  Intake 240 ml  Output 500 ml  Net -260 ml     Assessment/Plan:  74 y.o. male with peripheral artery disease who has a known left ext. Iliac artery occlusion who initially presented with left leg numbness. This was thought to be secondary to hypotension and anemia. GI workup has shown gastric ulcer. Hgb is stable at 9.1 today. His left lower extremity symptoms remain improved. He is not having rest pain, numbness or loss of motor or sensation.  Will  continue to follow with further planning of endovascular intervention when patient is stable from GI stand point   Karoline Caldwell, PA-C [facility] [phone] 04/13/2020 11:46 AM

## 2020-04-13 NOTE — Evaluation (Addendum)
Occupational Therapy Evaluation Patient Details Name: Keith Morales MRN: [id number] DOB: 05-12-1946 Today's Date: 04/13/2020    History of Present Illness Pt is 74 yo male who presents to the hospital with L Leg pain/numbness. Stroke was ruled out with imaging. Leg pain and numbess is due to occlusion of L femoral artery from PVD. Pt was also found to have a GI bleed and associated anemia. Pt had EGD, biopsy, polypectomy, and colonscopy done on 8/9. Pt has PMH of HTN, COPD, and PVD.    Clinical Impression   Pt admitted with the above diagnoses and presents with below problem list. Pt will benefit from continued acute OT to address the below listed deficits and maximize independence with basic ADLs prior to d/c home. PTA pt was independent with most ADLs, endorses some assist with bathing (gets down in the tub for a soaking tub bath). Pt limited by fatigue and pain but able to walk in place about 30 seconds 2x and take pivotal steps to sit up in recliner. Pt declined mobilizing in the room citing concern for triggering bowel incontinence. Pt currently setup with UB ADLs, min guard to light min A with LB ADLs. Plan to introduce some energy conservation education next session as it relates to ADLs.       Follow Up Recommendations  Home health OT;Supervision/Assistance - 24 hour    Equipment Recommendations  3 in 1 bedside commode    Recommendations for Other Services       Precautions / Restrictions Precautions Precautions: Fall Restrictions Weight Bearing Restrictions: No      Mobility Bed Mobility Overal bed mobility: Needs Assistance Bed Mobility: Supine to Sit     Supine to sit: Supervision;HOB elevated     General bed mobility comments: pt required supervision for safety with supine to sit  Extra time and effort 2/2 pain and fatigue  Transfers Overall transfer level: Needs assistance Equipment used: Rolling walker (2 wheeled) Transfers: Sit to/from [employer] Sit to Stand: Min guard Stand pivot transfers: Min guard       General transfer comment: close min guard initially. utilized rw. to/from EOB and recliner. Educated on hand placement during transfer with rw.     Balance Overall balance assessment: Needs assistance Sitting-balance support: Feet supported;No upper extremity supported Sitting balance-Leahy Scale: Fair     Standing balance support: No upper extremity supported;During functional activity;Bilateral upper extremity supported Standing balance-Leahy Scale: Poor Standing balance comment: external support of rw vs legs against bed in standing.                            ADL either performed or assessed with clinical judgement   ADL Overall ADL's : Needs assistance/impaired Eating/Feeding: Set up;Sitting   Grooming: Set up;Sitting   Upper Body Bathing: Set up;Sitting   Lower Body Bathing: Minimal assistance;Sit to/from stand;Min guard   Upper Body Dressing : Set up;Sitting   Lower Body Dressing: Minimal assistance;Sit to/from stand;Min guard   Toilet Transfer: Min guard;Ambulation;BSC;RW Toilet Transfer Details (indicate cue type and reason): walked in place beside bed then pivotal steps to recliner. Pt declined mobilizing in the room 2/2 concern of triggering bowel incontinence Toileting- Clothing Manipulation and Hygiene: Min guard;Minimal assistance;Sit to/from stand   Tub/ Shower Transfer: Minimal assistance   Functional mobility during ADLs: Min guard;Rolling walker General ADL Comments: Utilized rw. extra time and effort 2/2 pain and fatigue.      Vision  Perception     Praxis      Pertinent Vitals/Pain Pain Assessment: Faces Pain Score: 4  Faces Pain Scale: Hurts little more Pain Location: abdominal Pain Descriptors / Indicators: Guarding;Constant Pain Intervention(s): Premedicated before session;Monitored during session;Limited activity within patient's  tolerance;Repositioned     Hand Dominance     Extremity/Trunk Assessment Upper Extremity Assessment Upper Extremity Assessment: Generalized weakness   Lower Extremity Assessment Lower Extremity Assessment: Defer to PT evaluation   Cervical / Trunk Assessment Cervical / Trunk Assessment: Normal   Communication Communication Communication: No difficulties   Cognition Arousal/Alertness: Awake/alert Behavior During Therapy: WFL for tasks assessed/performed Overall Cognitive Status: Within Functional Limits for tasks assessed                                     General Comments  HR 93-95 at start and end of session    Exercises     Shoulder Instructions      Home Living Family/patient expects to be discharged to:: Private residence Living Arrangements: Spouse/significant other Available Help at Discharge: Family Type of Home: House Home Access: Stairs to enter Technical brewer of Steps: 4 Entrance Stairs-Rails: Left Home Layout: Able to live on main level with bedroom/bathroom;Two level     Bathroom Shower/Tub: Teacher, early years/pre: Handicapped height Bathroom Accessibility: Yes   Home Equipment: None   Additional Comments: gets into tub for tub bath at baseline      Prior Functioning/Environment Level of Independence: Needs assistance  Gait / Transfers Assistance Needed: Was independent with ambulation ADL's / Homemaking Assistance Needed: Reports he required bathing assist from his wife.            OT Problem List: Decreased strength;Decreased activity tolerance;Impaired balance (sitting and/or standing);Decreased knowledge of precautions;Decreased knowledge of use of DME or AE;Pain      OT Treatment/Interventions: Self-care/ADL training;Therapeutic exercise;Energy conservation;DME and/or AE instruction;Therapeutic activities;Patient/family education;Balance training    OT Goals(Current goals can be found in the care  plan section) Acute Rehab OT Goals Patient Stated Goal: be pain free to get home OT Goal Formulation: With patient Time For Goal Achievement: 04/27/20 Potential to Achieve Goals: Good ADL Goals Pt Will Perform Grooming: with modified independence;standing Pt Will Perform Lower Body Bathing: with modified independence;sit to/from stand Pt Will Perform Lower Body Dressing: with modified independence;sit to/from stand Pt Will Transfer to Toilet: with modified independence;ambulating;bedside commode Pt Will Perform Toileting - Clothing Manipulation and hygiene: with modified independence;sit to/from stand Pt Will Perform Tub/Shower Transfer: Tub transfer;with supervision;ambulating;3 in 1;rolling walker  OT Frequency: Min 2X/week   Barriers to D/C:            Co-evaluation              AM-PAC OT "6 Clicks" Daily Activity     Outcome Measure Help from another person eating meals?: None Help from another person taking care of personal grooming?: None Help from another person toileting, which includes using toliet, bedpan, or urinal?: A Little Help from another person bathing (including washing, rinsing, drying)?: A Little Help from another person to put on and taking off regular upper body clothing?: None Help from another person to put on and taking off regular lower body clothing?: A Little 6 Click Score: 21   End of Session Equipment Utilized During Treatment: Rolling walker  Activity Tolerance: Patient limited by fatigue;Patient limited by pain Patient left: in  chair;with call bell/phone within reach;with chair alarm set  OT Visit Diagnosis: Unsteadiness on feet (R26.81);Pain;Muscle weakness (generalized) (M62.81)                Time: 5300-5110 OT Time Calculation (min): 19 min Charges:  OT General Charges $OT Visit: 1 Visit OT Evaluation $OT Eval Low Complexity: [place], OT Acute Rehabilitation Services Pager: [phone] Office:  [phone]  Hortencia Pilar 04/13/2020, 10:58 AM

## 2020-04-14 DIAGNOSIS — R338 Other retention of urine: Secondary | ICD-10-CM

## 2020-04-14 LAB — BASIC METABOLIC PANEL
Anion gap: 10 (ref 5–15)
BUN: 17 mg/dL (ref 8–23)
CO2: 23 mmol/L (ref 22–32)
Calcium: 8.6 mg/dL — ABNORMAL LOW (ref 8.9–10.3)
Chloride: 105 mmol/L (ref 98–111)
Creatinine, Ser: 1.34 mg/dL — ABNORMAL HIGH (ref 0.61–1.24)
GFR calc Af Amer: >60 mL/min (ref >60)
GFR calc non Af Amer: 52 mL/min — ABNORMAL LOW (ref >60)
Glucose, Bld: 145 mg/dL — ABNORMAL HIGH (ref 70–99)
Potassium: 3.2 mmol/L — ABNORMAL LOW (ref 3.5–5.1)
Sodium: 138 mmol/L (ref 135–145)

## 2020-04-14 LAB — CBC
HCT: 27.7 % — ABNORMAL LOW (ref 39.0–52.0)
Hemoglobin: 9.3 g/dL — ABNORMAL LOW (ref 13.0–17.0)
MCH: 29.8 pg (ref 26.0–34.0)
MCHC: 33.6 g/dL (ref 30.0–36.0)
MCV: 88.8 fL (ref 80.0–100.0)
Platelets: 259 10*3/uL (ref 150–400)
RBC: 3.12 MIL/uL — ABNORMAL LOW (ref 4.22–5.81)
RDW: 14.9 % (ref 11.5–15.5)
WBC: 14.4 10*3/uL — ABNORMAL HIGH (ref 4.0–10.5)
nRBC: 0 % (ref 0.0–0.2)

## 2020-04-14 LAB — GLUCOSE, CAPILLARY: Glucose-Capillary: 136 mg/dL — ABNORMAL HIGH (ref 70–99)

## 2020-04-14 MED ORDER — AMLODIPINE BESYLATE 5 MG PO TABS
5.0000 mg | ORAL_TABLET | Freq: Every day | ORAL | Status: DC
  Administered 2020-04-14 – 2020-04-16 (×3): 5 mg via ORAL
  Filled 2020-04-14 (×3): qty 1

## 2020-04-14 MED ORDER — POTASSIUM CHLORIDE CRYS ER 20 MEQ PO TBCR
40.0000 meq | EXTENDED_RELEASE_TABLET | Freq: Once | ORAL | Status: AC
  Administered 2020-04-14: 40 meq via ORAL
  Filled 2020-04-14: qty 2

## 2020-04-14 NOTE — Progress Notes (Addendum)
Pt's wife called nurse's station requesting to speak to pt. Asked pt if he wanted to accept a phone at the moment, pt responded "no" and that he wanted to rest. Told wife that pt was resting and that he would call her later. Pt's wife was upset that the pt did not want to take a phone call. Will continue to monitor.

## 2020-04-14 NOTE — Progress Notes (Signed)
[facility]   Subjective  - Patient states his left LE feels much better and he is at his baseline.   Objective (!) 147/106 (!) 108 99.6 F (37.6 C) (Oral) 18 99%  Intake/Output Summary (Last 24 hours) at 04/14/2020 0707 Last data filed at 04/14/2020 0428 Gross per 24 hour  Intake 240 ml  Output 3310 ml  Net -3070 ml    Left LE warm to touch with intact sensation and motor. Heart RRR Lungs non labored breathing  Assessment/Planning:  74 y.o. male with peripheral artery disease who has a known left ext. Iliac artery occlusion who initially presented with left leg numbness.  He is at baseline with function, motor and sensation in the left LE.  Warm to touch.     HGB stable @ 9.3 acute blood loss anemia after 2 units PRBC 8/7 & 04/10/20.  Pending future plans for vascular intervention per Dr. Trula Slade.    Roxy Horseman 04/14/2020 7:07 AM --  Laboratory Lab Results: Recent Labs    04/13/20 0753 04/14/20 0556  WBC 20.9* 14.4*  HGB 9.1* 9.3*  HCT 27.3* 27.7*  PLT 275 259   BMET Recent Labs    04/13/20 0753 04/14/20 0556  NA 135 138  K 3.1* 3.2*  CL 106 105  CO2 19* 23  GLUCOSE 155* 145*  BUN 25* 17  CREATININE 2.49* 1.34*  CALCIUM 8.5* 8.6*    COAG Lab Results  Component Value Date   INR 1.1 04/09/2020   No results found for: PTT

## 2020-04-14 NOTE — Progress Notes (Signed)
Progress Note    [name]  HTD:[id number] DOB: [birth year]  DOA: 04/09/2020 PCP: Miguel Aschoff, MD    Brief Narrative:     Medical records reviewed and are as summarized below:  Keith Morales is an 74 y.o. male was seen as a code stroke initially upon arrival into the emergency department due to his complaints. CT angiogram of the head and neck was negative for any acute abnormalities. Vital signs were noted to be stable. After further evaluation symptoms were thought more likely vascular in nature and code stroke was canceled. Labs significant for hemoglobin 7 (previously 12.7 g/dL on 8/1)and sodium 130. Stool guaiacs were noted to be positive. Dr. Holland Falling was formally consulted. The case was discussed with Dr. Trula Slade of vascular surgery who ordered ABIs and duplex studies. [facility] called to admit.  Assessment/Plan:   Principal Problem:   GI bleed Active Problems:   PVD (peripheral vascular disease) (HCC)   Hyponatremia   Weight loss   Severe protein-calorie malnutrition (HCC)   Occlusion of common femoral artery (HCC)   Acute urinary retention    GI bleedwith acute blood loss anemia:  Patient found to have a hemoglobin of 7 g/dL with positive stool guaiacs.Hemoglobin previously noted to be 12.7 on 8/1. He reports prior history of polyps. EGD/colonoscopy on 8/9 with large gastric ulcer, 1 polyp in the cecum, 2 polyps descending colon, one polyp hepatic flexure which were removed/biopsied. --[facility] following; appreciate assistance --s/p 2u pRBC on 8/7 --Hgb improved --protonix 40mg  PO BID x 8 weeks, followed by once daily --soft diet --Follow-up pathology from biopsies -trend CBC  Peripheral vascular disease:  Patientcomplained of left leg pain acute numbness initially but this has now resolved Thought less likely to be secondary to stroke and more like disease. Noted during CT scan to have chronicocclusion of the left common femoral artery with  reconstitution of flow on previous CT.  ABI with critical limb ischemia.  Ultrasound duplex of vascular left lower extremity with total occlusion left common femoral artery and areas of occlusion mid to distal SFA. Lipid panel with total cholesterol 111, HDL 33, LDL 57, triglycerides 103. --Vascular surgery following, Dr. Trula Slade, appreciate assistance --Vascular surgery believes paresthesias to lower extremities due to a low flow state given his significant anemia and hypertension and currently delaying further vascular work-up until anemia is stemming from GI source have resolved as he will require IV heparin for an angiogram -will clarify with vascular if procedure is to be done in hospital or outpatient --Started atorvastatin 40 mg p.o. daily  Hyponatremia: Acute.  -resolved  Hypokalemia -replete -Mg normal  Leukocytosis -not clear source -no fever  Low back pain Patient complains of persistent, chronic low back pain.   -denies sensation changes -MRI:  1.Multilevel spondylosis. Asymmetric L4-5 disc bulge with abutment of the right greater than left exiting L4 nerve roots. Superimposed small central protrusion abuts the descending right S1 nerve root. 2. Mild to moderate L3-4 spinal canal and mild bilateral neural foraminal narrowing. 3. Mild right L2-3 and L4-5 neural foraminal narrowing.  Weight loss/underweight: Severe protein calorie malnutrition BMI 14.99 kg/m.Patient reports weight loss of over 30 pounds over the last month or so. Last hemoglobin A1c 5 and TSH 2.408 from lab work 03/17/2020 on care everywhere. -outpatient follow up  Tobacco use:  Patient stated he no longer smokes cigarettes, but does smokeBlack& Mildevery now and then.  Counseled on need for complete cessation given his peripheral vascular disease.  Hypokalemia -  replete  Acute urinary retention -enlarged prostate -foley placed due to >900 on bladder scan -will need urology outpatient  follow up -suspect elevated CR caused by hydronephrosis -added flomax  Family Communication/Anticipated D/C date and plan/Code Status   DVT prophylaxis: scd Code Status: Full Code.  Disposition Plan: Status is: Inpatient  Remains inpatient appropriate because:Inpatient level of care appropriate due to severity of illness   Dispo: The patient is from: Home              Anticipated d/c is to: Home              Anticipated d/c date is: pending vascular plan              Patient currently is not medically stable to d/c.         Medical Consultants:    GI  vascular    Subjective:  No further abdominal pain No numbness or tingling  Objective:    Vitals:   04/13/20 1841 04/13/20 2348 04/14/20 0632 04/14/20 1226  BP: (!) 150/103 (!) 140/107 (!) 147/106 (!) 139/92  Pulse: (!) 106 96 (!) 108 (!) 106  Resp: 18 18 18 17   Temp: 99.2 F (37.3 C) 99 F (37.2 C) 99.6 F (37.6 C) 98.8 F (37.1 C)  TempSrc: Oral Oral Oral Oral  SpO2: 98% 100% 99% 97%  Weight:      Height:        Intake/Output Summary (Last 24 hours) at 04/14/2020 1453 Last data filed at 04/14/2020 0428 Gross per 24 hour  Intake 240 ml  Output 2460 ml  Net -2220 ml   Filed Weights   04/09/20 1022 04/10/20 0258  Weight: 43.4 kg 42.3 kg    Exam:  General: Appearance:    Thin male in no acute distress     Lungs:     Clear to auscultation bilaterally, respirations unlabored  Heart:    Tachycardic. Normal rhythm. No murmurs, rubs, or gallops.   MS:   All extremities are intact.   Neurologic:   Awake, alert, oriented x 3. No apparent focal neurological           defect.     Data Reviewed:   I have personally reviewed following labs and imaging studies:  Labs: Labs show the following:   Basic Metabolic Panel: Recent Labs  Lab 04/10/20 1008 04/10/20 1008 04/11/20 0211 04/11/20 0211 04/12/20 0312 04/12/20 0312 04/13/20 0753 04/14/20 0556  NA 134*  --  135  --  136  --  135 138  K 3.6    < > 3.3*   < > 3.3*   < > 3.1* 3.2*  CL 101  --  103  --  105  --  106 105  CO2 22  --  23  --  21*  --  19* 23  GLUCOSE 134*  --  118*  --  123*  --  155* 145*  BUN 23  --  19  --  21  --  25* 17  CREATININE 1.07  --  1.23  --  1.94*  --  2.49* 1.34*  CALCIUM 8.7*  --  8.8*  --  8.4*  --  8.5* 8.6*  MG  --   --   --   --  2.0  --   --   --    < > = values in this interval not displayed.   GFR Estimated Creatinine Clearance: 29.4 mL/min (A) (by C-G formula  based on SCr of 1.34 mg/dL (H)). Liver Function Tests: Recent Labs  Lab 04/09/20 0915  AST 15  ALT 10  ALKPHOS 44  BILITOT 0.7  PROT 5.2*  ALBUMIN 2.8*   No results for input(s): LIPASE, AMYLASE in the last 168 hours. No results for input(s): AMMONIA in the last 168 hours. Coagulation profile Recent Labs  Lab 04/09/20 0915  INR 1.1    CBC: Recent Labs  Lab 04/09/20 0915 04/09/20 0917 04/10/20 1008 04/10/20 1008 04/10/20 1803 04/11/20 0211 04/12/20 0312 04/13/20 0753 04/14/20 0556  WBC 8.8   < > 15.4*  --   --  21.4* 20.7* 20.9* 14.4*  NEUTROABS 6.3  --   --   --   --   --   --   --   --   HGB 7.0*   < > 9.9*   < > 9.9* 10.3* 8.9* 9.1* 9.3*  HCT 20.8*   < > 29.3*   < > 28.8* 30.7* 26.5* 27.3* 27.7*  MCV 90.0   < > 85.7  --   --  88.7 89.8 89.5 88.8  PLT 238   < > 274  --   --  298 267 275 259   < > = values in this interval not displayed.   Cardiac Enzymes: No results for input(s): CKTOTAL, CKMB, CKMBINDEX, TROPONINI in the last 168 hours. BNP (last 3 results) No results for input(s): PROBNP in the last 8760 hours. CBG: Recent Labs  Lab 04/09/20 0912 04/14/20 1111  GLUCAP 169* 136*   D-Dimer: No results for input(s): DDIMER in the last 72 hours. Hgb A1c: No results for input(s): HGBA1C in the last 72 hours. Lipid Profile: No results for input(s): CHOL, HDL, LDLCALC, TRIG, CHOLHDL, LDLDIRECT in the last 72 hours. Thyroid function studies: No results for input(s): TSH, T4TOTAL, T3FREE, THYROIDAB  in the last 72 hours.  Invalid input(s): FREET3 Anemia work up: No results for input(s): VITAMINB12, FOLATE, FERRITIN, TIBC, IRON, RETICCTPCT in the last 72 hours. Sepsis Labs: Recent Labs  Lab 04/11/20 0211 04/12/20 0312 04/13/20 0753 04/14/20 0556  WBC 21.4* 20.7* 20.9* 14.4*    Microbiology Recent Results (from the past 240 hour(s))  SARS Coronavirus 2 by RT PCR (hospital order, performed in [facility] hospital lab) Nasopharyngeal Nasopharyngeal Swab     Status: None   Collection Time: 04/09/20 12:47 PM   Specimen: Nasopharyngeal Swab  Result Value Ref Range Status   SARS Coronavirus 2 NEGATIVE NEGATIVE Final    Comment: (NOTE) SARS-CoV-2 target nucleic acids are NOT DETECTED.  The SARS-CoV-2 RNA is generally detectable in upper and lower respiratory specimens during the acute phase of infection. The lowest concentration of SARS-CoV-2 viral copies this assay can detect is 250 copies / mL. A negative result does not preclude SARS-CoV-2 infection and should not be used as the sole basis for treatment or other patient management decisions.  A negative result may occur with improper specimen collection / handling, submission of specimen other than nasopharyngeal swab, presence of viral mutation(s) within the areas targeted by this assay, and inadequate number of viral copies (<250 copies / mL). A negative result must be combined with clinical observations, patient history, and epidemiological information.  Fact Sheet for Patients:   StrictlyIdeas.no  Fact Sheet for Healthcare Providers: BankingDealers.co.za  This test is not yet approved or  cleared by the [place] FDA and has been authorized for detection and/or diagnosis of SARS-CoV-2 by FDA under an Emergency Use Authorization (EUA).  This EUA will remain in effect (meaning this test can be used) for the duration of the COVID-19 declaration under Section 564(b)(1) of  the Act, 21 U.S.C. section 360bbb-3(b)(1), unless the authorization is terminated or revoked sooner.  Performed at [facility], [place] [address] [place], [place] [address]     Procedures and diagnostic studies:  MR LUMBAR SPINE WO CONTRAST  Result Date: 04/12/2020 CLINICAL DATA:  Low back pain. EXAM: MRI LUMBAR SPINE WITHOUT CONTRAST TECHNIQUE: Multiplanar, multisequence MR imaging of the lumbar spine was performed. No intravenous contrast was administered. COMPARISON:  03/21/2020 CT abdomen pelvis. FINDINGS: Please note that motion artifact limits evaluation. Segmentation: L5 sacralization. There are 4 lumbar type non rib-bearing vertebral bodies. Alignment: Minimal grade 1 T12-L1 retrolisthesis. Grade 1 L3-4 anterolisthesis. Vertebrae: Diffuse bone marrow heterogeneity. Scattered T1/T2 hyperintense foci reflect hemangiomata versus focal fat. Conus medullaris and cauda equina: Conus extends to the L1 level. Conus and cauda equina appear normal. Disc levels: Multilevel desiccation and disc space loss most prominent at the T12-L1 and L4-5 levels. L1-2: Disc bulge, mild ligamentum flavum and bilateral facet hypertrophy. Partial effacement of the ventral CSF containing spaces. No significant neural foraminal narrowing. L2-3: Mild disc bulge, ligamentum flavum hypertrophy and bilateral facet degenerative spurring. Mild right neural foraminal narrowing. No significant spinal canal or left neural foraminal narrowing. L3-4: Disc bulge with superimposed central protrusion, ligamentum flavum and bilateral facet hypertrophy. There is also left foraminal annular fissuring. Mild to moderate spinal canal and mild bilateral neural foraminal narrowing. Trace bilateral facet joint effusions. L4-5: Disc bulge demonstrating asymmetric right extraforaminal/foraminal predominance with abutment of the exiting right L4 nerve root (8:30). There is minimal abutment of the left exiting L4 nerve root. Tiny superimposed  central protrusion abutting the descending right S1 nerve root. Bilateral facet hypertrophy. Partial effacement of the lateral recesses. Mild right neural foraminal narrowing. No significant left neural foraminal narrowing. L5-S1: No significant disc bulge, spinal canal or neural foraminal narrowing. Paraspinal and other soft tissues: Bilateral renal cysts. Bilateral pelviectasis with mild hydroureter. Partially imaged distended urinary bladder with dome approaching the umbilicus. Small hiatal hernia. IMPRESSION: 1. Transitional lumbosacral anatomy with L5 sacralization. 2. Multilevel spondylosis. Asymmetric L4-5 disc bulge with abutment of the right greater than left exiting L4 nerve roots. Superimposed small central protrusion abuts the descending right S1 nerve root. 3. Mild to moderate L3-4 spinal canal and mild bilateral neural foraminal narrowing. 4. Mild right L2-3 and L4-5 neural foraminal narrowing. 5. Distended urinary bladder with mild bilateral hydroureteronephrosis. Query partial bladder outlet obstruction, likely secondary to prostatomegaly. Electronically Signed   By: Primitivo Gauze M.D.   On: 04/12/2020 16:52    Medications:   . (feeding supplement) PROSource Plus  30 mL Oral BID BM  . atorvastatin  40 mg Oral Daily  . Chlorhexidine Gluconate Cloth  6 each Topical Daily  . feeding supplement (ENSURE ENLIVE)  237 mL Oral BID BM  . ferrous sulfate  325 mg Oral Daily  . pantoprazole  40 mg Oral BID  . sodium chloride flush  3 mL Intravenous Once  . sodium chloride flush  3 mL Intravenous Q12H  . tamsulosin  0.4 mg Oral Daily   Continuous Infusions: . sodium chloride       LOS: 5 days   Geradine Girt  [facility]   How to contact the [facility] Attending or Consulting provider [place] or covering provider during after hours [place], for this patient?  1. Check the  care team in [facility] and look for a) attending/consulting [place] provider listed and b) the [facility] team listed 2. Log  into [email or web address] and use [place]'s universal password to access. If you do not have the password, please contact the hospital operator. 3. Locate the [facility] provider you are looking for under [facility] and page to a number that you can be directly reached. 4. If you still have difficulty reaching the provider, please page the [facility] (Director on Call) for the Hospitalists listed on amion for assistance.  04/14/2020, 2:53 PM

## 2020-04-14 NOTE — Progress Notes (Signed)
Physical Therapy Treatment Patient Details Name: Keith Morales MRN: [id number] DOB: 10/27/1945 Today's Date: 04/14/2020    History of Present Illness Pt is 74 yo male who presents to the hospital with L Leg pain/numbness. Stroke was ruled out with imaging. Leg pain and numbess is due to occlusion of L femoral artery from PVD. Pt was also found to have a GI bleed and associated anemia. Pt had EGD, biopsy, polypectomy, and colonscopy done on 8/9. Pt has PMH of HTN, COPD, and PVD.     PT Comments    Pt in bed upon arrival of PT, reports significant fatigue today and declined OOB mobility and ambulation progression, but agreeable to seated LE exercises. The pt was educated in a series of LE exercises to improve AROM and strength. The pt's HR was 120 bpm sitting EOB initially, increased to 126 bpm with seated exercises, and increased to 130 bpm with lateral scooting to return to bed. The pt will continue to benefit from skilled PT to progress functional strength in BLE, activity endurance, and dynamic stability, but will also benefit from general mobility with other staff members as possible.     Follow Up Recommendations  Home health PT     Equipment Recommendations  Rolling walker with 5" wheels    Recommendations for Other Services       Precautions / Restrictions Precautions Precautions: Fall Precaution Comments: watch HR Restrictions Weight Bearing Restrictions: No    Mobility  Bed Mobility Overal bed mobility: Needs Assistance Bed Mobility: Supine to Sit;Sit to Supine     Supine to sit: Supervision;HOB elevated Sit to supine: Supervision;HOB elevated   General bed mobility comments: supervision for safety with use of bed rail and elevated HOB to come to sitting EOB  Transfers Overall transfer level: Needs assistance Equipment used: None Transfers: Lateral/Scoot Transfers          Lateral/Scoot Transfers: Supervision General transfer comment: lateral scoot to return  to [facility] without assist or AD. great hip clearance. pt declined further mobility due to reports of significant fatigue. HR 130 bpm with lateral scoot        Balance Overall balance assessment: Needs assistance Sitting-balance support: Feet supported;No upper extremity supported Sitting balance-Leahy Scale: Fair Sitting balance - Comments: pt able to maintain sitting balance at EOB withiout UE support   Standing balance support: Bilateral upper extremity supported;During functional activity Standing balance-Leahy Scale: Poor Standing balance comment: pt declined full stand and transfers, but able to scoot with great hip clearance with BUE support                            Cognition Arousal/Alertness: Awake/alert Behavior During Therapy: WFL for tasks assessed/performed Overall Cognitive Status: Within Functional Limits for tasks assessed                                        Exercises General Exercises - Lower Extremity Long Arc Quad: Strengthening;Both;20 reps;Seated (x5 AROM, x5 against graded manual resistance x2) Heel Slides: Strengthening;Both;20 reps;Seated (x5 AROM, x5 against graded manual resistance x2) Heel Raises: AROM;Both;15 reps;Seated (15 reps x 2)    General Comments General comments (skin integrity, edema, etc.): HR 120 bpm sitting EOB, elevated to 126 bpm with seated exercises and 130 bpm with lateral scoot to return to bed      Pertinent Vitals/Pain Pain Assessment: No/denies  pain Pain Intervention(s): Monitored during session;Repositioned           PT Goals (current goals can now be found in the care plan section) Acute Rehab PT Goals Patient Stated Goal: be pain free to get home PT Goal Formulation: With patient Time For Goal Achievement: 04/26/20 Potential to Achieve Goals: Fair Progress towards PT goals: Progressing toward goals    Frequency    Min 3X/week      PT Plan         AM-PAC PT "6 Clicks" Mobility    Outcome Measure  Help needed turning from your back to your side while in a flat bed without using bedrails?: None Help needed moving from lying on your back to sitting on the side of a flat bed without using bedrails?: None Help needed moving to and from a bed to a chair (including a wheelchair)?: A Little Help needed standing up from a chair using your arms (e.g., wheelchair or bedside chair)?: A Little Help needed to walk in hospital room?: A Little Help needed climbing 3-5 steps with a railing? : A Little 6 Click Score: 20    End of Session Equipment Utilized During Treatment: Gait belt Activity Tolerance: Patient limited by fatigue Patient left: in bed;with bed alarm set;with call bell/phone within reach Nurse Communication: Mobility status PT Visit Diagnosis: Muscle weakness (generalized) (M62.81);Pain     Time: 4010-2725 PT Time Calculation (min) (ACUTE ONLY): 16 min  Charges:  $Therapeutic Exercise: 8-22 mins                     [name], PT, DPT   Acute Rehabilitation Department Pager #: [phone]   [name] 04/14/2020, 4:30 PM
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
## 2020-04-15 LAB — BASIC METABOLIC PANEL
Anion gap: 11 (ref 5–15)
BUN: 14 mg/dL (ref 8–23)
CO2: 27 mmol/L (ref 22–32)
Calcium: 8.7 mg/dL — ABNORMAL LOW (ref 8.9–10.3)
Chloride: 102 mmol/L (ref 98–111)
Creatinine, Ser: 1.04 mg/dL (ref 0.61–1.24)
GFR calc Af Amer: >60 mL/min (ref >60)
GFR calc non Af Amer: >60 mL/min (ref >60)
Glucose, Bld: 105 mg/dL — ABNORMAL HIGH (ref 70–99)
Potassium: 3.5 mmol/L (ref 3.5–5.1)
Sodium: 140 mmol/L (ref 135–145)

## 2020-04-15 NOTE — Progress Notes (Signed)
Progress Note    Keith Morales  EXB:[id number] DOB: Jan 09, 1946  DOA: 04/09/2020 PCP: Miguel Aschoff, MD    Brief Narrative:     Medical records reviewed and are as summarized below:  Keith Morales is an 74 y.o. male was seen as a code stroke initially upon arrival into the emergency department due to his complaints. CT angiogram of the head and neck was negative for any acute abnormalities. Vital signs were noted to be stable. After further evaluation symptoms were thought more likely vascular in nature and code stroke was canceled. Labs significant for hemoglobin 7 (previously 12.7 g/dL on 8/1)and sodium 130. Stool guaiacs were noted to be positive. Dr. Holland Falling was formally consulted. The case was discussed with Dr. Trula Slade of vascular surgery who ordered ABIs and duplex studies. [facility] called to admit.  Assessment/Plan:   Principal Problem:   GI bleed Active Problems:   PVD (peripheral vascular disease) (HCC)   Hyponatremia   Weight loss   Severe protein-calorie malnutrition (HCC)   Occlusion of common femoral artery (HCC)   Acute urinary retention    GI bleedwith acute blood loss anemia:  Patient found to have a hemoglobin of 7 g/dL with positive stool guaiacs.Hemoglobin previously noted to be 12.7 on 8/1. He reports prior history of polyps. EGD/colonoscopy on 8/9 with large gastric ulcer, 1 polyp in the cecum, 2 polyps descending colon, one polyp hepatic flexure which were removed/biopsied. --[facility] following; appreciate assistance --s/p 2u pRBC on 8/7 --Hgb improved --protonix 40mg  PO BID x 8 weeks, followed by once daily --soft diet  Peripheral vascular disease:  Patientcomplained of left leg pain acute numbness initially but this has now resolved Thought less likely to be secondary to stroke and more like disease. Noted during CT scan to have chronicocclusion of the left common femoral artery with reconstitution of flow on previous CT.  ABI with  critical limb ischemia.  Ultrasound duplex of vascular left lower extremity with total occlusion left common femoral artery and areas of occlusion mid to distal SFA. Lipid panel with total cholesterol 111, HDL 33, LDL 57, triglycerides 103. --Vascular surgery following, Dr. Trula Slade, appreciate assistance --Vascular surgery believes paresthesias to lower extremities due to a low flow state given his significant anemia and hypertension and currently delaying further vascular work-up until anemia is stemming from GI source have resolved as he will require IV heparin for an angiogram --Started atorvastatin 40 mg p.o. daily -no need for emergent procedure so vascular will follow up outpatient  Hyponatremia: Acute.  -resolved  Hypokalemia -replete -Mg normal  Leukocytosis -not clear source -no fever  Low back pain Patient complains of persistent, chronic low back pain.   -denies sensation changes -MRI:  1.Multilevel spondylosis. Asymmetric L4-5 disc bulge with abutment of the right greater than left exiting L4 nerve roots. Superimposed small central protrusion abuts the descending right S1 nerve root. 2. Mild to moderate L3-4 spinal canal and mild bilateral neural foraminal narrowing. 3. Mild right L2-3 and L4-5 neural foraminal narrowing.  Weight loss/underweight: Severe protein calorie malnutrition BMI 14.99 kg/m.Patient reports weight loss of over 30 pounds over the last month or so. Last hemoglobin A1c 5 and TSH 2.408 from lab work 03/17/2020 on care everywhere. -outpatient follow up  Tobacco use:  Patient stated he no longer smokes cigarettes, but does smokeBlack& Mildevery now and then.  Counseled on need for complete cessation given his peripheral vascular disease.  Hypokalemia -replete  Acute urinary retention -enlarged prostate -foley placed  due to >900 on bladder scan -[facility]  To call with appointment -suspect elevated CR caused by  hydronephrosis -added flomax  Family Communication/Anticipated D/C date and plan/Code Status   DVT prophylaxis: scd Code Status: Full Code.  Disposition Plan: Status is: Inpatient  Remains inpatient appropriate because:Inpatient level of care appropriate due to severity of illness   Dispo: The patient is from: Home              Anticipated d/c is to: Home              Anticipated d/c date is: 8/14 after education for foley management at home              Patient currently is not medically stable to d/c.         Medical Consultants:    GI  vascular    Subjective:  Worried about managing the foley at home   Objective:    Vitals:   04/14/20 1725 04/15/20 0033 04/15/20 0551 04/15/20 1250  BP: (!) 143/90 126/89 (!) 130/95 (!) 120/92  Pulse: (!) 101 98 94 (!) 108  Resp: 18 16 16 16   Temp: 98.5 F (36.9 C) 99.1 F (37.3 C) 98.7 F (37.1 C) 98.8 F (37.1 C)  TempSrc: Oral Oral Oral Oral  SpO2: 98% 98% 97% 98%  Weight:      Height:        Intake/Output Summary (Last 24 hours) at 04/15/2020 1400 Last data filed at 04/14/2020 2100 Gross per 24 hour  Intake 240 ml  Output 1000 ml  Net -760 ml   Filed Weights   04/09/20 1022 04/10/20 0258  Weight: 43.4 kg 42.3 kg    Exam:  In bed, NAD, rrr, no increased work of breathing, foley in place and draining clear urine                    Data Reviewed:   I have personally reviewed following labs and imaging studies:  Labs: Labs show the following:   Basic Metabolic Panel: Recent Labs  Lab 04/11/20 0211 04/11/20 0211 04/12/20 0312 04/12/20 0312 04/13/20 0753 04/13/20 0753 04/14/20 0556 04/15/20 0629  NA 135  --  136  --  135  --  138 140  K 3.3*   < > 3.3*   < > 3.1*   < > 3.2* 3.5  CL 103  --  105  --  106  --  105 102  CO2 23  --  21*  --  19*  --  23 27  GLUCOSE 118*  --  123*  --  155*  --  145* 105*  BUN 19  --  21  --  25*  --  17 14  CREATININE 1.23  --  1.94*  --  2.49*  --  1.34*  1.04  CALCIUM 8.8*  --  8.4*  --  8.5*  --  8.6* 8.7*  MG  --   --  2.0  --   --   --   --   --    < > = values in this interval not displayed.   GFR Estimated Creatinine Clearance: 37.8 mL/min (by C-G formula based on SCr of 1.04 mg/dL). Liver Function Tests: Recent Labs  Lab 04/09/20 0915  AST 15  ALT 10  ALKPHOS 44  BILITOT 0.7  PROT 5.2*  ALBUMIN 2.8*   No results for input(s): LIPASE, AMYLASE in the last 168 hours. No results  for input(s): AMMONIA in the last 168 hours. Coagulation profile Recent Labs  Lab 04/09/20 0915  INR 1.1    CBC: Recent Labs  Lab 04/09/20 0915 04/09/20 0917 04/10/20 1008 04/10/20 1008 04/10/20 1803 04/11/20 0211 04/12/20 0312 04/13/20 0753 04/14/20 0556  WBC 8.8   < > 15.4*  --   --  21.4* 20.7* 20.9* 14.4*  NEUTROABS 6.3  --   --   --   --   --   --   --   --   HGB 7.0*   < > 9.9*   < > 9.9* 10.3* 8.9* 9.1* 9.3*  HCT 20.8*   < > 29.3*   < > 28.8* 30.7* 26.5* 27.3* 27.7*  MCV 90.0   < > 85.7  --   --  88.7 89.8 89.5 88.8  PLT 238   < > 274  --   --  298 267 275 259   < > = values in this interval not displayed.   Cardiac Enzymes: No results for input(s): CKTOTAL, CKMB, CKMBINDEX, TROPONINI in the last 168 hours. BNP (last 3 results) No results for input(s): PROBNP in the last 8760 hours. CBG: Recent Labs  Lab 04/09/20 0912 04/14/20 1111  GLUCAP 169* 136*   D-Dimer: No results for input(s): DDIMER in the last 72 hours. Hgb A1c: No results for input(s): HGBA1C in the last 72 hours. Lipid Profile: No results for input(s): CHOL, HDL, LDLCALC, TRIG, CHOLHDL, LDLDIRECT in the last 72 hours. Thyroid function studies: No results for input(s): TSH, T4TOTAL, T3FREE, THYROIDAB in the last 72 hours.  Invalid input(s): FREET3 Anemia work up: No results for input(s): VITAMINB12, FOLATE, FERRITIN, TIBC, IRON, RETICCTPCT in the last 72 hours. Sepsis Labs: Recent Labs  Lab 04/11/20 0211 04/12/20 0312 04/13/20 0753 04/14/20 0556   WBC 21.4* 20.7* 20.9* 14.4*    Microbiology Recent Results (from the past 240 hour(s))  SARS Coronavirus 2 by RT PCR (hospital order, performed in [facility] hospital lab) Nasopharyngeal Nasopharyngeal Swab     Status: None   Collection Time: 04/09/20 12:47 PM   Specimen: Nasopharyngeal Swab  Result Value Ref Range Status   SARS Coronavirus 2 NEGATIVE NEGATIVE Final    Comment: (NOTE) SARS-CoV-2 target nucleic acids are NOT DETECTED.  The SARS-CoV-2 RNA is generally detectable in upper and lower respiratory specimens during the acute phase of infection. The lowest concentration of SARS-CoV-2 viral copies this assay can detect is 250 copies / mL. A negative result does not preclude SARS-CoV-2 infection and should not be used as the sole basis for treatment or other patient management decisions.  A negative result may occur with improper specimen collection / handling, submission of specimen other than nasopharyngeal swab, presence of viral mutation(s) within the areas targeted by this assay, and inadequate number of viral copies (<250 copies / mL). A negative result must be combined with clinical observations, patient history, and epidemiological information.  Fact Sheet for Patients:   StrictlyIdeas.no  Fact Sheet for Healthcare Providers: BankingDealers.co.za  This test is not yet approved or  cleared by the [place] FDA and has been authorized for detection and/or diagnosis of SARS-CoV-2 by FDA under an Emergency Use Authorization (EUA).  This EUA will remain in effect (meaning this test can be used) for the duration of the COVID-19 declaration under Section 564(b)(1) of the Act, 21 U.S.C. section 360bbb-3(b)(1), unless the authorization is terminated or revoked sooner.  Performed at [facility], [address],  [place] [address]     Procedures and diagnostic studies:  No results  found.  Medications:   . (feeding supplement) PROSource Plus  30 mL Oral BID BM  . amLODipine  5 mg Oral Daily  . atorvastatin  40 mg Oral Daily  . Chlorhexidine Gluconate Cloth  6 each Topical Daily  . feeding supplement (ENSURE ENLIVE)  237 mL Oral BID BM  . ferrous sulfate  325 mg Oral Daily  . pantoprazole  40 mg Oral BID  . sodium chloride flush  3 mL Intravenous Q12H  . tamsulosin  0.4 mg Oral Daily   Continuous Infusions: . sodium chloride       LOS: 6 days   Geradine Girt  [facility]   How to contact the [facility] Attending or Consulting provider [place] or covering provider during after hours [place], for this patient?  1. Check the care team in [facility] and look for a) attending/consulting [facility] provider listed and b) the [facility] team listed 2. Log into [email or web address] and use [place]'s universal password to access. If you do not have the password, please contact the hospital operator. 3. Locate the [facility] provider you are looking for under [facility] and page to a number that you can be directly reached. 4. If you still have difficulty reaching the provider, please page the [facility] (Director on Call) for the Hospitalists listed on amion for assistance.  04/15/2020, 2:00 PM

## 2020-04-15 NOTE — Progress Notes (Signed)
Occupational Therapy Treatment Patient Details Name: Keith Morales MRN: [id number] DOB: [birth year] Today's Date: 04/15/2020    History of present illness Pt is 74 yo male who presents to the hospital with L Leg pain/numbness. Stroke was ruled out with imaging. Leg pain and numbess is due to occlusion of L femoral artery from PVD. Pt was also found to have a GI bleed and associated anemia. Pt had EGD, biopsy, polypectomy, and colonscopy done on 8/9. Pt has PMH of HTN, COPD, and PVD.    OT comments  Pt progressing with OT goals and demonstrated ability to mobilize to/from sink for standing ADLs using RW. Pt required min guard at most to ensure stability on feet. Pt HR 125bpm at most during activity, but endorsed fatigue and SOB with tasks. Provided education on energy conservation strategies and home setup with handout provided.    Follow Up Recommendations  Home health OT;Supervision/Assistance - 24 hour    Equipment Recommendations  3 in 1 bedside commode;Tub/shower bench    Recommendations for Other Services      Precautions / Restrictions Precautions Precautions: Fall Precaution Comments: watch HR Restrictions Weight Bearing Restrictions: No       Mobility Bed Mobility Overal bed mobility: Needs Assistance Bed Mobility: Supine to Sit;Sit to Supine     Supine to sit: Supervision;HOB elevated Sit to supine: Min assist   General bed mobility comments: Min A to return B LE back into bed  Transfers Overall transfer level: Needs assistance Equipment used: Rolling walker (2 wheeled) Transfers: Sit to/from Stand Sit to Stand: Supervision         General transfer comment: Supervision for sit to stand with increased time/effort    Balance Overall balance assessment: Needs assistance Sitting-balance support: Feet supported;No upper extremity supported Sitting balance-Leahy Scale: Fair Sitting balance - Comments: pt able to maintain sitting balance at EOB withiout UE  support   Standing balance support: Bilateral upper extremity supported;During functional activity Standing balance-Leahy Scale: Poor Standing balance comment: reliant on RW or leaning on sink during ADLs                           ADL either performed or assessed with clinical judgement   ADL Overall ADL's : Needs assistance/impaired     Grooming: Min guard;Standing;Wash/dry face;Oral care Grooming Details (indicate cue type and reason): Pt min guard progressing to supervision at times for oral care standing at sink >3 minutes. Pt endorses some SOB/fatigue but vitals WFL on RA                             Functional mobility during ADLs: Min guard;Rolling walker General ADL Comments: Pt with improved mobility using RW, though continues with fatigue/SOB after tasks. Time spent educating on energy conservation strategies during ADLs with handout provided.      Vision   Vision Assessment?: No apparent visual deficits   Perception     Praxis      Cognition Arousal/Alertness: Awake/alert Behavior During Therapy: WFL for tasks assessed/performed Overall Cognitive Status: Within Functional Limits for tasks assessed                                          Exercises     Shoulder Instructions       General Comments HR up  to 125bpm at most during activites, though SOB/fatigued after tasks    Pertinent Vitals/ Pain       Pain Assessment: No/denies pain  Home Living                                          Prior Functioning/Environment              Frequency  Min 2X/week        Progress Toward Goals  OT Goals(current goals can now be found in the care plan section)  Progress towards OT goals: Progressing toward goals  Acute Rehab OT Goals Patient Stated Goal: be pain free to get home OT Goal Formulation: With patient Time For Goal Achievement: 04/27/20 Potential to Achieve Goals: Good ADL Goals Pt  Will Perform Grooming: with modified independence;standing Pt Will Perform Lower Body Bathing: with modified independence;sit to/from stand Pt Will Perform Lower Body Dressing: with modified independence;sit to/from stand Pt Will Transfer to Toilet: with modified independence;ambulating;bedside commode Pt Will Perform Toileting - Clothing Manipulation and hygiene: with modified independence;sit to/from stand Pt Will Perform Tub/Shower Transfer: Tub transfer;with supervision;ambulating;3 in 1;rolling walker  Plan Discharge plan remains appropriate    Co-evaluation                 AM-PAC OT "6 Clicks" Daily Activity     Outcome Measure   Help from another person eating meals?: None Help from another person taking care of personal grooming?: None Help from another person toileting, which includes using toliet, bedpan, or urinal?: A Little Help from another person bathing (including washing, rinsing, drying)?: A Little Help from another person to put on and taking off regular upper body clothing?: None Help from another person to put on and taking off regular lower body clothing?: A Little 6 Click Score: 21    End of Session Equipment Utilized During Treatment: Gait belt;Rolling walker  OT Visit Diagnosis: Unsteadiness on feet (R26.81);Pain;Muscle weakness (generalized) (M62.81)   Activity Tolerance Patient tolerated treatment well   Patient Left in bed;with call bell/phone within reach;with bed alarm set   Nurse Communication          Time: 7673-4193 OT Time Calculation (min): 28 min  Charges: OT General Charges $OT Visit: 1 Visit OT Treatments $Self Care/Home Management : 8-22 mins $Therapeutic Activity: 8-22 mins  Layla Maw, OTR/L   Layla Maw 04/15/2020, 12:31 PM

## 2020-04-15 NOTE — Plan of Care (Signed)
°  Problem: Education: Goal: Knowledge of General Education information will improve Description: Including pain rating scale, medication(s)/side effects and non-pharmacologic comfort measures Outcome: Progressing   Problem: Clinical Measurements: Goal: Ability to maintain clinical measurements within normal limits will improve Outcome: Progressing Goal: Will remain free from infection Outcome: Progressing Goal: Respiratory complications will improve Outcome: Progressing   Problem: Clinical Measurements: Goal: Will remain free from infection Outcome: Progressing   Problem: Clinical Measurements: Goal: Respiratory complications will improve Outcome: Progressing

## 2020-04-15 NOTE — Progress Notes (Signed)
Physical Therapy Treatment Patient Details Name: Keith Morales MRN: [id number] DOB: 1946-03-03 Today's Date: 04/15/2020    History of Present Illness Pt is 74 yo male who presents to the hospital with L Leg pain/numbness. Stroke was ruled out with imaging. Leg pain and numbess is due to occlusion of L femoral artery from PVD. Pt was also found to have a GI bleed and associated anemia. Pt had EGD, biopsy, polypectomy, and colonscopy done on 8/9. Pt has PMH of HTN, COPD, and PVD.     PT Comments    Pt in bed upon arrival of PT, agreeable to session due to anticipation of d/c home soon. The pt was able to demo short ambulation in room with use of RW and supervision for safety only, but declined further ambulation at this time due to reports of fatigue. The pt was able to demo good safety awareness with mobility and transfers, but declined opportunity to complete stair training prior to d/c. The pt verbalized understanding of stair navigation technique and reports he has no concerns or questions about returning home with his wife.     Follow Up Recommendations  Home health PT     Equipment Recommendations  Rolling walker with 5" wheels    Recommendations for Other Services       Precautions / Restrictions Precautions Precautions: Fall Precaution Comments: watch HR Restrictions Weight Bearing Restrictions: No    Mobility  Bed Mobility Overal bed mobility: Needs Assistance Bed Mobility: Supine to Sit;Sit to Supine     Supine to sit: Supervision;HOB elevated Sit to supine: Supervision   General bed mobility comments: pt able to come to EOB and return to supine without assist  Transfers Overall transfer level: Needs assistance Equipment used: Rolling walker (2 wheeled) Transfers: Sit to/from Stand Sit to Stand: Supervision         General transfer comment: supervision for sit-stand without need for VC for hand positioning. very slow on eccentric  lower  Ambulation/Gait Ambulation/Gait assistance: Supervision Gait Distance (Feet): 25 Feet Assistive device: Rolling walker (2 wheeled) Gait Pattern/deviations: Step-through pattern;Decreased stride length;Shuffle;Trunk flexed Gait velocity: decreased Gait velocity interpretation: <1.31 ft/sec, indicative of household ambulator General Gait Details: pt with slow but steady gait in room, declined further gait due to reports of fatigue. supervision for safety.   Stairs - pt declined due to fatigue             Wheelchair Mobility    Modified Rankin (Stroke Patients Only)       Balance Overall balance assessment: Needs assistance Sitting-balance support: Feet supported;No upper extremity supported Sitting balance-Leahy Scale: Fair Sitting balance - Comments: pt able to maintain sitting balance at EOB withiout UE support     Standing balance-Leahy Scale: Poor Standing balance comment: reliant on BUE support for ambulation and static stance                            Cognition Arousal/Alertness: Awake/alert Behavior During Therapy: WFL for tasks assessed/performed Overall Cognitive Status: Within Functional Limits for tasks assessed                                        Exercises      General Comments General comments (skin integrity, edema, etc.): HR to 117 following ambulation in room      Pertinent Vitals/Pain Pain Assessment: No/denies pain  Pain Intervention(s): Limited activity within patient's tolerance;Monitored during session           PT Goals (current goals can now be found in the care plan section) Acute Rehab PT Goals Patient Stated Goal: be pain free to get home PT Goal Formulation: With patient Time For Goal Achievement: 04/26/20 Potential to Achieve Goals: Fair    Frequency    Min 3X/week       AM-PAC PT "6 Clicks" Mobility   Outcome Measure  Help needed turning from your back to your side while in a  flat bed without using bedrails?: None Help needed moving from lying on your back to sitting on the side of a flat bed without using bedrails?: None Help needed moving to and from a bed to a chair (including a wheelchair)?: A Little Help needed standing up from a chair using your arms (e.g., wheelchair or bedside chair)?: None Help needed to walk in hospital room?: A Little Help needed climbing 3-5 steps with a railing? : A Little 6 Click Score: 21    End of Session Equipment Utilized During Treatment: Gait belt Activity Tolerance: Patient limited by fatigue Patient left: in bed;with bed alarm set;with call bell/phone within reach Nurse Communication: Mobility status PT Visit Diagnosis: Muscle weakness (generalized) (M62.81);Pain     Time: [id number] PT Time Calculation (min) (ACUTE ONLY): 13 min  Charges:  $Gait Training: 8-22 mins                     [name], PT, DPT   Acute Rehabilitation Department Pager #: [phone]   [name] 04/15/2020, 3:16 PM

## 2020-04-15 NOTE — Progress Notes (Signed)
RN taught pt about leg bag and how to apply it to the leg and drain it when it is time. Pt stated understanding and did not have any questions.

## 2020-04-15 NOTE — TOC Progression Note (Signed)
Transition of Care [facility]) - Progression Note    Patient Details  Name: Keith Morales MRN: [id number] Date of Birth: [birth year]  [facility] CM/SW Contact  Pollie Friar, RN Phone Number: 04/15/2020, 2:34 PM  Clinical Narrative:    CM met with the patient to make sure he felt safe discharging home when ready. Pt states his significant other gets worked up verbally but is never physical and would never hurt anyone.  HH is aware of the significant others behavior and are still willing to see him for [facility]  Walker for home in room.  Significant other states she can get transportation arranged for the patient when he is ready for d/c.    Expected Discharge Plan: [place] Barriers to Discharge: Continued Medical Work up  Expected Discharge Plan and Services Expected Discharge Plan: [place]   Discharge Planning Services: CM Consult Post Acute Care Choice: [place] arrangements for the past 2 months: Single Family Home                                       Social Determinants of Health (SDOH) Interventions    Readmission Risk Interventions No flowsheet data found.

## 2020-04-15 NOTE — Progress Notes (Signed)
Foley care education done with patient. Patient was able to demonstrate and verbalize to how foley care will be done at home upon discharge.

## 2020-04-16 MED ORDER — BISACODYL 10 MG RE SUPP: 10.0000 mg | Freq: Every day | RECTAL | 0 refills | Status: AC | PRN

## 2020-04-16 MED ORDER — FERROUS SULFATE 325 (65 FE) MG PO TABS: 325.0000 mg | ORAL_TABLET | Freq: Two times a day (BID) | ORAL | 3 refills | Status: AC

## 2020-04-16 MED ORDER — ATORVASTATIN CALCIUM 40 MG PO TABS: 40.0000 mg | ORAL_TABLET | Freq: Every day | ORAL | 0 refills | Status: AC

## 2020-04-16 MED ORDER — TAMSULOSIN HCL 0.4 MG PO CAPS: 0.4000 mg | ORAL_CAPSULE | Freq: Every day | ORAL | 0 refills | Status: AC

## 2020-04-16 MED ORDER — PANTOPRAZOLE SODIUM 40 MG PO TBEC: 40.0000 mg | DELAYED_RELEASE_TABLET | Freq: Two times a day (BID) | ORAL | 0 refills | Status: AC

## 2020-04-16 NOTE — Discharge Summary (Signed)
Physician Discharge Summary  Keith Morales WYO:[id number] DOB: Aug 18, 1946 DOA: 04/09/2020  PCP: Miguel Aschoff, MD  Admit date: 04/09/2020 Discharge date: 04/16/2020  Admitted From: home Discharge disposition: home   Recommendations for Outpatient Follow-Up:   1. Outpatient urology follow up for voiding trial- they will call with appointment 2. Outpatient vascular follow up 3. 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  Diet recommendation: Low sodium, heart healthy.  Carbohydrate-modified.  Regular.  Wound care: None.  Code status: Full.   History of Present Illness:  Keith Morales is a 74 y.o. male with medical history significant of hypertension and COPD who presented with complaints of left leg numbness and pain.  Over the last couple weeks to months he has been having abdominal pain, decreased appetite, and weight loss of at least 30 pounds.  He has only been able to drink liquids despite family's attempts.  Over the last week or so he has been having dark stools.  He last had 2 bowel movements yesterday.  Denies being on any blood thinners are taking any NSAIDs.  Knee pain on the left leg started acutely today and thereafter he developed numbness for which she was unable to get up and ambulate.  He reports that he last had a colonoscopy with Dr.Mann [facility] few years ago was noted to have a couple polyps which were removed.  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  He quit smoking cigarettes over 20 years ago, but intermittently smokes Black & Mild every now and then.   Hospital Course by Problem:   GI bleedwith acute blood loss anemia:  Patient found to have a hemoglobin of 7 g/dL with positive stool guaiacs.Hemoglobin previously noted to be 12.7 on 8/1. He reports prior history of polyps. EGD/colonoscopy on 8/9 with large gastric ulcer, 1 polyp in the cecum, 2 polyps descending colon, one polyp hepatic flexure which were removed/biopsied. --[facility] following; appreciate assistance --s/p 2u pRBC on 8/7 --Hgb improved --protonix 40mg  PO BID x 8 weeks, followed by once daily --soft diet  Peripheral vascular disease:  Patientcomplained of left leg pain acute numbness initially but this has now resolved Thought less likely to be secondary to stroke and more like disease. Noted during CT scan to have chronicocclusion of the left common femoral artery with reconstitution of flow on previous CT. ABI with critical limb ischemia. Ultrasound duplex of vascular left lower extremity with total occlusion left common femoral artery and areas of occlusion mid to distal SFA. Lipid panel with total cholesterol 111, HDL 33, LDL 57, triglycerides 103. --Vascular surgery following, Dr. Trula Slade, appreciate assistance --Vascular surgery believes paresthesias to lower extremities due to a low flow state given his significant anemia and hypertension and currently delaying further vascular work-up until anemia is stemming from GI source have resolved as he will require IV heparin for an angiogram --Started atorvastatin 40 mg p.o. daily -no need for emergent  procedure so vascular will follow up outpatient  Hyponatremia: Acute.  -resolved  Hypokalemia -replete -Mg normal  Low back pain Patient complains of persistent, chronic low back pain.  -denies sensation changes -MRI:  1.Multilevel  spondylosis. Asymmetric L4-5 disc bulge with abutment of the right greater than left exiting L4 nerve roots. Superimposed small central protrusion abuts the descending right S1 nerve root. 2. Mild to moderate L3-4 spinal canal and mild bilateral neural foraminal narrowing. 3. Mild right L2-3 and L4-5 neural foraminal narrowing.  Weight loss/underweight: Severe protein calorie malnutrition BMI 14.99 kg/m.Patient reports weight loss of over 30 pounds over the last month or so. Last hemoglobin A1c 5 and TSH 2.408 from lab work 03/17/2020 on care everywhere. -outpatient follow up  Tobacco use:  Patient stated he no longer smokes cigarettes, but does smokeBlack& Mildevery now and then. Counseled on need for complete cessation given his peripheral vascular disease.  Hypokalemia -replete  Acute urinary retention -enlarged prostate -foley placed due to >900 on bladder scan -[facility]  To call with appointment -suspect elevated CR caused by hydronephrosis -added flomax    Medical Consultants:    Vascular GI  Discharge Exam:   Vitals:   04/16/20 0030 04/16/20 0500  BP: (!) 141/104 (!) 134/91  Pulse: 88 96  Resp: 16 17  Temp: 99.5 F (37.5 C) 99.1 F (37.3 C)  SpO2: 99% 94%   Vitals:   04/15/20 1250 04/15/20 1810 04/16/20 0030 04/16/20 0500  BP: (!) 120/92 (!) 142/90 (!) 141/104 (!) 134/91  Pulse: (!) 108 (!) 106 88 96  Resp: 16 16 16 17   Temp: 98.8 F (37.1 C) 99.1 F (37.3 C) 99.5 F (37.5 C) 99.1 F (37.3 C)  TempSrc: Oral Oral Oral Oral  SpO2: 98% 97% 99% 94%  Weight:      Height:        General exam: Appears calm and comfortable.    The results of significant diagnostics from this hospitalization (including imaging, microbiology, ancillary and laboratory) are listed below for reference.     Procedures and Diagnostic Studies:   CT Code Stroke CTA Head W/WO contrast  Result Date: 04/09/2020 CLINICAL DATA:  Focal neuro deficit, stroke  suspected. Right-sided weakness. EXAM: CT ANGIOGRAPHY HEAD AND NECK TECHNIQUE: Multidetector CT imaging of the head and neck was performed using the standard protocol during bolus administration of intravenous contrast. Multiplanar CT image reconstructions and MIPs were obtained to evaluate the vascular anatomy. Carotid stenosis measurements (when applicable) are obtained utilizing NASCET criteria, using the distal internal carotid diameter as the denominator. CONTRAST:  27mL OMNIPAQUE IOHEXOL 350 MG/ML SOLN COMPARISON:  No pertinent prior exams are available for comparison. FINDINGS: CTA NECK FINDINGS Aortic arch: Standard aortic branching. No hemodynamically significant innominate or proximal subclavian artery stenosis. Right carotid system: CCA and ICA patent within the neck without stenosis Left carotid system: CCA and ICA patent within the neck without stenosis. Vertebral arteries: Codominant and patent within the neck without stenosis Skeleton: No acute bony abnormality identified. Cervical spondylosis. Other neck: No neck mass or cervical lymphadenopathy. Upper chest: No consolidation within the imaged lung apices. Pulmonary emphysema Review of the MIP images confirms the above findings CTA HEAD FINDINGS Anterior circulation: The intracranial internal carotid arteries are patent. Minimal nonstenotic plaque within both vessels. The M1 middle cerebral arteries are patent without significant stenosis. No M2 proximal branch occlusion or high-grade proximal stenosis is identified. The anterior cerebral arteries are patent. No intracranial aneurysm is identified. Posterior circulation: The intracranial vertebral  arteries are patent. The basilar artery is patent. The posterior cerebral arteries are patent. A left posterior communicating artery is present. The right posterior communicating artery is hypoplastic or absent. Venous sinuses: Within limitations of contrast timing, no convincing thrombus. Anatomic  variants: As described Review of the MIP images confirms the above findings No emergent large vessel occlusion. These results were communicated to Dr. Leonel Ramsay At 9:45 amon 8/7/2021by text page via the [facility] messaging system. IMPRESSION: CTA neck: The common carotid, internal carotid and vertebral arteries are patent within the neck without hemodynamically significant stenosis. CTA head: 1. No intracranial large vessel occlusion or proximal high-grade arterial stenosis. 2. Mild non-stenotic plaque within both intracranial internal carotid arteries. Electronically Signed   By: Kellie Simmering DO   On: 04/09/2020 09:45   CT Code Stroke CTA Neck W/WO contrast  Result Date: 04/09/2020 CLINICAL DATA:  Focal neuro deficit, stroke suspected. Right-sided weakness. EXAM: CT ANGIOGRAPHY HEAD AND NECK TECHNIQUE: Multidetector CT imaging of the head and neck was performed using the standard protocol during bolus administration of intravenous contrast. Multiplanar CT image reconstructions and MIPs were obtained to evaluate the vascular anatomy. Carotid stenosis measurements (when applicable) are obtained utilizing NASCET criteria, using the distal internal carotid diameter as the denominator. CONTRAST:  33mL OMNIPAQUE IOHEXOL 350 MG/ML SOLN COMPARISON:  No pertinent prior exams are available for comparison. FINDINGS: CTA NECK FINDINGS Aortic arch: Standard aortic branching. No hemodynamically significant innominate or proximal subclavian artery stenosis. Right carotid system: CCA and ICA patent within the neck without stenosis Left carotid system: CCA and ICA patent within the neck without stenosis. Vertebral arteries: Codominant and patent within the neck without stenosis Skeleton: No acute bony abnormality identified. Cervical spondylosis. Other neck: No neck mass or cervical lymphadenopathy. Upper chest: No consolidation within the imaged lung apices. Pulmonary emphysema Review of the MIP images confirms the above findings  CTA HEAD FINDINGS Anterior circulation: The intracranial internal carotid arteries are patent. Minimal nonstenotic plaque within both vessels. The M1 middle cerebral arteries are patent without significant stenosis. No M2 proximal branch occlusion or high-grade proximal stenosis is identified. The anterior cerebral arteries are patent. No intracranial aneurysm is identified. Posterior circulation: The intracranial vertebral arteries are patent. The basilar artery is patent. The posterior cerebral arteries are patent. A left posterior communicating artery is present. The right posterior communicating artery is hypoplastic or absent. Venous sinuses: Within limitations of contrast timing, no convincing thrombus. Anatomic variants: As described Review of the MIP images confirms the above findings No emergent large vessel occlusion. These results were communicated to Dr. Leonel Ramsay At 9:45 amon 8/7/2021by text page via the [facility] messaging system. IMPRESSION: CTA neck: The common carotid, internal carotid and vertebral arteries are patent within the neck without hemodynamically significant stenosis. CTA head: 1. No intracranial large vessel occlusion or proximal high-grade arterial stenosis. 2. Mild non-stenotic plaque within both intracranial internal carotid arteries. Electronically Signed   By: Kellie Simmering DO   On: 04/09/2020 09:45   DG Chest Port 1 View  Result Date: 04/09/2020 CLINICAL DATA:  Weight loss, smoker EXAM: PORTABLE CHEST 1 VIEW COMPARISON:  04/04/2011 FINDINGS: Hyperinflation. No focal consolidation. No pleural effusion or pneumothorax. The heart is normal in size.  Thoracic aortic atherosclerosis. Calcified mediastinal/hilar lymph nodes. IMPRESSION: No evidence of acute cardiopulmonary disease. Electronically Signed   By: Julian Hy M.D.   On: 04/09/2020 10:45   VAS [place] ABI WITH/WO TBI  Result Date: 04/10/2020 LOWER EXTREMITY DOPPLER STUDY Indications: Rest pain, numbness,  peripheral artery  disease, and Known left              iliac occlusion. High Risk Factors: Hypertension.  Comparison Study: No prior study on file Performing Technologist: Sharion Dove RVS  Examination Guidelines: A complete evaluation includes at minimum, Doppler waveform signals and systolic blood pressure reading at the level of bilateral brachial, anterior tibial, and posterior tibial arteries, when vessel segments are accessible. Bilateral testing is considered an integral part of a complete examination. Photoelectric Plethysmograph (PPG) waveforms and toe systolic pressure readings are included as required and additional duplex testing as needed. Limited examinations for reoccurring indications may be performed as noted.  ABI Findings: +---------+------------------+-----+---------+--------+ Right    Rt Pressure (mmHg)IndexWaveform Comment  +---------+------------------+-----+---------+--------+ Brachial 125                    triphasic         +---------+------------------+-----+---------+--------+ PTA                             absent            +---------+------------------+-----+---------+--------+ DP                              absent            +---------+------------------+-----+---------+--------+ Great Toe                       Absent            +---------+------------------+-----+---------+--------+ +---------+------------------+-----+--------+-------+ Left     Lt Pressure (mmHg)IndexWaveformComment +---------+------------------+-----+--------+-------+ PTA                             absent          +---------+------------------+-----+--------+-------+ DP                              absent          +---------+------------------+-----+--------+-------+ Great Toe                       Absent          +---------+------------------+-----+--------+-------+ +-------+-----------+-----------+------------+------------+ ABI/TBIToday's ABIToday's TBIPrevious  ABIPrevious TBI +-------+-----------+-----------+------------+------------+ Right  absent     absent                              +-------+-----------+-----------+------------+------------+ Left   absent     absent                              +-------+-----------+-----------+------------+------------+  Summary: Right: Resting right ankle-brachial index indicates critical limb ischemia. Absent waveforms in the DP, PT and great toe. Left: Resting left ankle-brachial index indicates critical left limb ischemia. Absent waveforms in the DP, PT and great toe.  *See table(s) above for measurements and observations.  Electronically signed by Harold Barban MD on 04/10/2020 at 4:57:53 PM.   Final    CT HEAD CODE STROKE WO CONTRAST  Result Date: 04/09/2020 CLINICAL DATA:  Code stroke. Provided history: Right-sided weakness. EXAM: CT HEAD WITHOUT CONTRAST TECHNIQUE: Contiguous axial images were obtained from the base of the skull through the vertex without intravenous contrast. COMPARISON:  No pertinent prior exams are available for comparison. FINDINGS: Brain: Moderate generalized parenchymal atrophy. Moderate patchy T2/FLAIR hyperintensity within the cerebral white matter is nonspecific, but consistent with chronic small vessel ischemic disease. There is no acute intracranial hemorrhage. No demarcated cortical infarct is identified. No extra-axial fluid collection. No evidence of intracranial mass. No midline shift. Vascular: No hyperdense vessel.  Atherosclerotic calcifications. Skull: Normal. Negative for fracture or focal lesion. Sinuses/Orbits: Visualized orbits show no acute finding. Complete opacification of the left maxillary sinus with associated chronic reactive osteitis. Extensive partial opacification of the left frontal sinus with associated frothy secretions. No significant mastoid effusion. ASPECTS ([place] Stroke Program Early CT Score) - Ganglionic level infarction (caudate, lentiform nuclei,  internal capsule, insula, M1-M3 cortex): 7 - Supraganglionic infarction (M4-M6 cortex): 3 Total score (0-10 with 10 being normal): 10 These results were communicated to Dr. Leonel Ramsay At 9:26 amon 8/7/2021by text page via the [facility] messaging system. IMPRESSION: No CT evidence of acute intracranial abnormality. Moderate generalized parenchymal atrophy and chronic small vessel ischemic disease. Left frontal and maxillary sinusitis as described. Electronically Signed   By: Kellie Simmering DO   On: 04/09/2020 09:26   VAS [place] LOWER EXTREMITY ARTERIAL DUPLEX  Result Date: 04/09/2020 LOWER EXTREMITY ARTERIAL DUPLEX STUDY Indications: Rest pain and numbness, peripheral artery disease, and known left              iliac occlusion. High Risk Factors: Hypertension.  Current ABI: absent pulses bilaterally Comparison Study: No prior study on file Performing Technologist: Sharion Dove RVS  Examination Guidelines: A complete evaluation includes B-mode imaging, spectral Doppler, color Doppler, and power Doppler as needed of all accessible portions of each vessel. Bilateral testing is considered an integral part of a complete examination. Limited examinations for reoccurring indications may be performed as noted.  +----------+--------+-----+--------+----------+--------------------------------+ LEFT      PSV cm/sRatioStenosisWaveform  Comments                         +----------+--------+-----+--------+----------+--------------------------------+ CFA Prox               occludedabsent                                     +----------+--------+-----+--------+----------+--------------------------------+ CFA Mid                occludedabsent                                     +----------+--------+-----+--------+----------+--------------------------------+ CFA Distal             occludedabsent                                     +----------+--------+-----+--------+----------+--------------------------------+  DFA       45                                                              +----------+--------+-----+--------+----------+--------------------------------+ SFA Prox  47  monophasic                                 +----------+--------+-----+--------+----------+--------------------------------+ SFA Mid   31                   monophasicsome areas of occlusion noted                                             mid to distal                    +----------+--------+-----+--------+----------+--------------------------------+ SFA Distal11                   monophasicreconstituted flow               +----------+--------+-----+--------+----------+--------------------------------+ POP Prox  7                    monophasic                                 +----------+--------+-----+--------+----------+--------------------------------+ POP Mid                        monophasic                                 +----------+--------+-----+--------+----------+--------------------------------+ POP Distal10                                                              +----------+--------+-----+--------+----------+--------------------------------+ ATA Prox                                 not visualized                   +----------+--------+-----+--------+----------+--------------------------------+ ATA Mid                                  not visualized                   +----------+--------+-----+--------+----------+--------------------------------+ ATA Distal                               not visualized                   +----------+--------+-----+--------+----------+--------------------------------+ PTA Prox  13                   monophasic                                 +----------+--------+-----+--------+----------+--------------------------------+ PTA Mid   10                   monophasic                                  +----------+--------+-----+--------+----------+--------------------------------+  PTA Distal7                    monophasic                                 +----------+--------+-----+--------+----------+--------------------------------+  Summary: Left: 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 Areas of occlusion noted in the mid to distal SFA with reconstitution in the distal SFA. Unable to visualize ATA.  See table(s) above for measurements and observations. Electronically signed by Harold Barban MD on 04/09/2020 at 5:49:46 PM.    Final      Labs:   Basic Metabolic Panel: Recent Labs  Lab 04/11/20 0211 04/11/20 0211 04/12/20 2458 04/12/20 0998 04/13/20 3382 04/13/20 0753 04/14/20 0556 04/15/20 0629  NA 135  --  136  --  135  --  138 140  K 3.3*   < > 3.3*   < > 3.1*   < > 3.2* 3.5  CL 103  --  105  --  106  --  105 102  CO2 23  --  21*  --  19*  --  23 27  GLUCOSE 118*  --  123*  --  155*  --  145* 105*  BUN 19  --  21  --  25*  --  17 14  CREATININE 1.23  --  1.94*  --  2.49*  --  1.34* 1.04  CALCIUM 8.8*  --  8.4*  --  8.5*  --  8.6* 8.7*  MG  --   --  2.0  --   --   --   --   --    < > = values in this interval not displayed.   GFR Estimated Creatinine Clearance: 37.8 mL/min (by C-G formula based on SCr of 1.04 mg/dL). Liver Function Tests: No results for input(s): AST, ALT, ALKPHOS, BILITOT, PROT, ALBUMIN in the last 168 hours. No results for input(s): LIPASE, AMYLASE in the last 168 hours. No results for input(s): AMMONIA in the last 168 hours. Coagulation profile No results for input(s): INR, PROTIME in the last 168 hours.  CBC: Recent Labs  Lab 04/10/20 1008 04/10/20 1008 04/10/20 1803 04/11/20 0211 04/12/20 0312 04/13/20 0753 04/14/20 0556  WBC 15.4*  --   --  21.4* 20.7* 20.9* 14.4*  HGB 9.9*   < > 9.9* 10.3* 8.9* 9.1* 9.3*  HCT 29.3*   < > 28.8* 30.7* 26.5* 27.3* 27.7*  MCV 85.7  --   --  88.7 89.8 89.5 88.8  PLT 274  --   --  298 267 275  259   < > = values in this interval not displayed.   Cardiac Enzymes: No results for input(s): CKTOTAL, CKMB, CKMBINDEX, TROPONINI in the last 168 hours. BNP: Invalid input(s): POCBNP CBG: Recent Labs  Lab 04/14/20 1111  GLUCAP 136*   D-Dimer No results for input(s): DDIMER in the last 72 hours. Hgb A1c No results for input(s): HGBA1C in the last 72 hours. Lipid Profile No results for input(s): CHOL, HDL, LDLCALC, TRIG, CHOLHDL, LDLDIRECT in the last 72 hours. Thyroid function studies No results for input(s): TSH, T4TOTAL, T3FREE, THYROIDAB in the last 72 hours.  Invalid input(s): FREET3 Anemia work up No results for input(s): VITAMINB12, FOLATE, FERRITIN, TIBC, IRON, RETICCTPCT in the last 72 hours. Microbiology Recent Results (from the past 240 hour(s))  SARS Coronavirus 2 by RT PCR (hospital order, performed in  [facility] hospital lab) Nasopharyngeal Nasopharyngeal Swab     Status: None   Collection Time: 04/09/20 12:47 PM   Specimen: Nasopharyngeal Swab  Result Value Ref Range Status   SARS Coronavirus 2 NEGATIVE NEGATIVE Final    Comment: (NOTE) SARS-CoV-2 target nucleic acids are NOT DETECTED.  The SARS-CoV-2 RNA is generally detectable in upper and lower respiratory specimens during the acute phase of infection. The lowest concentration of SARS-CoV-2 viral copies this assay can detect is 250 copies / mL. A negative result does not preclude SARS-CoV-2 infection and should not be used as the sole basis for treatment or other patient management decisions.  A negative result may occur with improper specimen collection / handling, submission of specimen other than nasopharyngeal swab, presence of viral mutation(s) within the areas targeted by this assay, and inadequate number of viral copies (<250 copies / mL). A negative result must be combined with clinical observations, patient history, and epidemiological information.  Fact Sheet for Patients:    StrictlyIdeas.no  Fact Sheet for Healthcare Providers: BankingDealers.co.za  This test is not yet approved or  cleared by the [place] FDA and has been authorized for detection and/or diagnosis of SARS-CoV-2 by FDA under an Emergency Use Authorization (EUA).  This EUA will remain in effect (meaning this test can be used) for the duration of the COVID-19 declaration under Section 564(b)(1) of the Act, 21 U.S.C. section 360bbb-3(b)(1), unless the authorization is terminated or revoked sooner.  Performed at [facility], [place] [address], [place], [place] [address]      Discharge Instructions:   Discharge Instructions    Diet general   Complete by: As directed    AVOID SALT   Increase activity slowly   Complete by: As directed      Allergies as of 04/16/2020   No Known Allergies     Medication List    STOP taking these medications   HYDROcodone-acetaminophen 5-325 MG tablet Commonly known as: NORCO/VICODIN     TAKE these medications   atorvastatin 40 MG tablet Commonly known as: LIPITOR Take 1 tablet (40 mg total) by mouth daily. Notes to patient: 04/17/20   bisacodyl 10 MG suppository Commonly known as: DULCOLAX Place 1 suppository (10 mg total) rectally daily as needed for mild constipation.   ferrous sulfate 325 (65 FE) MG tablet Take 1 tablet (325 mg total) by mouth 2 (two) times daily with a meal. What changed: when to take this Notes to patient: 04/16/20   pantoprazole 40 MG tablet Commonly known as: PROTONIX Take 1 tablet (40 mg total) by mouth 2 (two) times daily. Notes to patient: 04/16/20   ProAir HFA 108 (90 Base) MCG/ACT inhaler Generic drug: albuterol Inhale 2 puffs into the lungs every 6 (six) hours as needed for shortness of breath or wheezing.   promethazine 25 MG tablet Commonly known as: PHENERGAN Take 25 mg by mouth every 6 (six) hours as needed for nausea/vomiting.   tamsulosin  0.4 MG Caps capsule Commonly known as: FLOMAX Take 1 capsule (0.4 mg total) by mouth daily. Notes to patient: 04/17/20            Durable Medical Equipment  (From admission, onward)         Start     Ordered   04/13/20 1133  For home use only DME Walker rolling  Once       Question Answer Comment  Walker: With 5 Inch Wheels   Patient needs a walker to treat  with the following condition Weakness      04/13/20 1134          Follow-up Information    [facility] Follow up.   Specialty: Home Health Services Why: The home health agency will contact you for the first home visit. Contact information: [place] [address] [phone]        Miguel Aschoff, MD Follow up in 1 week(s).   Specialty: Family Medicine Contact information: [address] [facility] [place] [address] [phone]        [facility] Follow up.   Why: they will call you with an appointment for voiding trial Contact information: [place] [phone]               Time coordinating discharge: 35 min  Signed:  Geradine Girt DO  [facility] 04/16/2020, 3:19 PM

## 2020-04-16 NOTE — Progress Notes (Signed)
Pt given discharge summary and discharged via wife as transportation.

## 2020-04-16 NOTE — Anesthesia Postprocedure Evaluation (Signed)
Anesthesia Post Note  Patient: Keith Morales  Procedure(s) Performed: ESOPHAGOGASTRODUODENOSCOPY (EGD) WITH PROPOFOL (N/A ) COLONOSCOPY WITH PROPOFOL (N/A ) BIOPSY POLYPECTOMY     Patient location during evaluation: Endoscopy Anesthesia Type: MAC Level of consciousness: awake and alert Pain management: pain level controlled Vital Signs Assessment: post-procedure vital signs reviewed and stable Respiratory status: spontaneous breathing, nonlabored ventilation, respiratory function stable and patient connected to nasal cannula oxygen Cardiovascular status: stable and blood pressure returned to baseline Postop Assessment: no apparent nausea or vomiting Anesthetic complications: no   No complications documented.  Last Vitals:  Vitals:   04/16/20 0030 04/16/20 0500  BP: (!) 141/104 (!) 134/91  Pulse: 88 96  Resp: 16 17  Temp: 37.5 C 37.3 C  SpO2: 99% 94%    Last Pain:  Vitals:   04/16/20 0800  TempSrc:   PainSc: 0-No pain                 Alvilda Mckenna

## 2020-08-23 IMAGING — DX DG CHEST 1V PORT
1 series · 1 of 1 positions shown · non-contrast
Comparison: 04/04/2011

CLINICAL DATA: Weight loss, smoker

EXAM:
PORTABLE CHEST 1 VIEW

[chest]
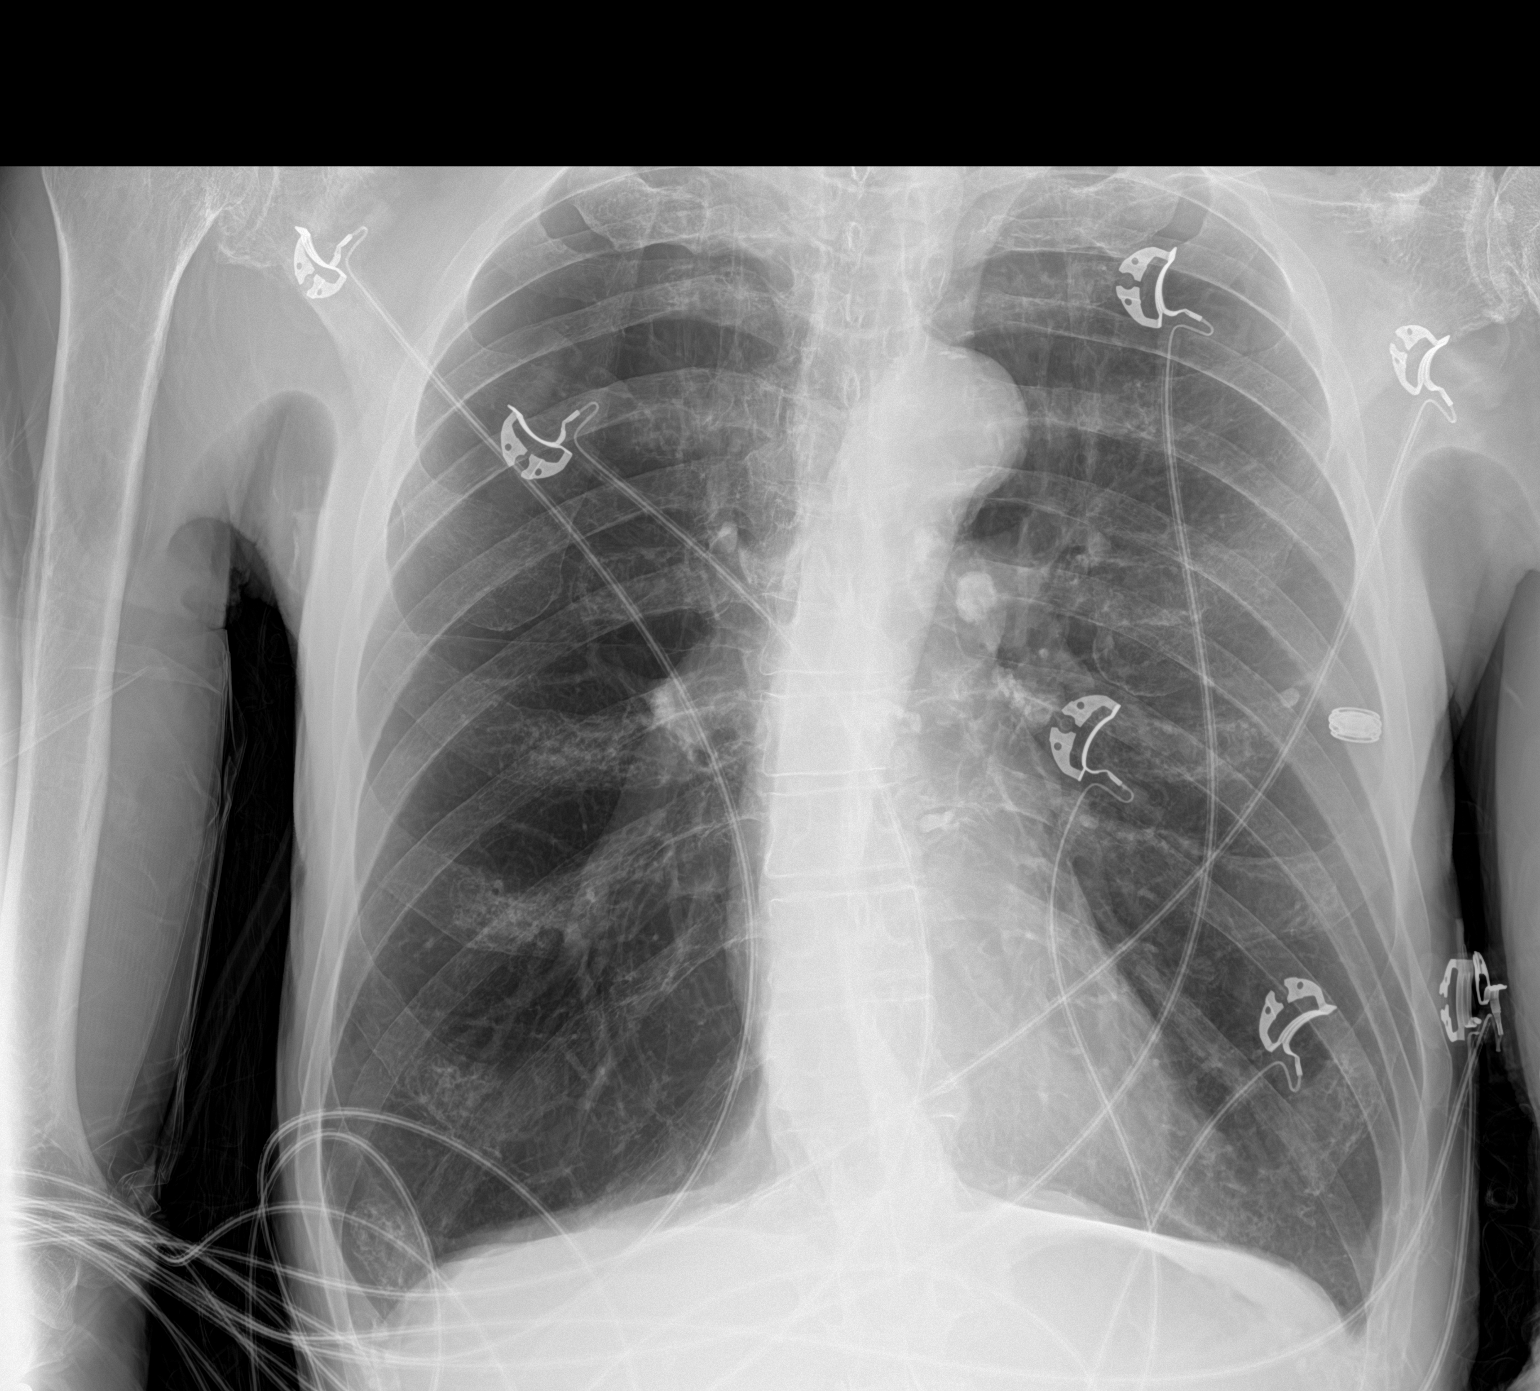

[1 of 1 positions shown; findings below may reference images not displayed]

FINDINGS: Hyperinflation. No focal consolidation. No pleural effusion or
pneumothorax.

The heart is normal in size.  Thoracic aortic atherosclerosis.

Calcified mediastinal/hilar lymph nodes.
IMPRESSION: No evidence of acute cardiopulmonary disease.

## 2022-11-06 ENCOUNTER — Ambulatory Visit (INDEPENDENT_AMBULATORY_CARE_PROVIDER_SITE_OTHER): Payer: Medicare PPO | Admitting: Clinical

## 2022-11-06 ENCOUNTER — Encounter (HOSPITAL_COMMUNITY): Payer: Self-pay | Admitting: Clinical

## 2022-11-06 DIAGNOSIS — F329 Major depressive disorder, single episode, unspecified: Secondary | ICD-10-CM | POA: Diagnosis not present

## 2022-11-06 DIAGNOSIS — F068 Other specified mental disorders due to known physiological condition: Secondary | ICD-10-CM

## 2022-11-06 NOTE — Progress Notes (Signed)
Comprehensive Clinical Assessment (CCA) Note  11/06/2022 Keith Morales [id number]  Chief Complaint:  Chief Complaint  Patient presents with   Establish Care   Visit Diagnosis:   Encounter Diagnoses  Name Primary?   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  He states that he has COPD and has an oxygen machine that keeps him from doing things like he used to be able to do.  He used to be a very active person and now "I can't do anything, I stay depressed all the time, just on borrowed time, can't do anything about it."  The only thing in his life that makes him happy is his dog and he state nobody else cares about him.  He just had surgery on his prostate, has COPD, HTN, a hole in his stomach, problems with urinating and more.  He states he is on a lot of medicine.  He has difficulty with ambulation and presents today in a wheelchair.  He is a [place] veteran and states the [employer] has not been very helpful about his mental health needs.  His "friend" complains about the length of the assessment throughout the time together and when options for treatment are presented, adamantly states that she will not allow him to be on medication for depression because "it makes things worse."  CSW also offered therapy which the patient declined.  Ultimately it was stated that he was looking for a letter enabling him to take his emotional support dog with him to doctor visits, and this was provided to him without hesitation as it was clear throughout the assessment that the dog is what makes him happy and keeps him calm.  His GAD-7 score is 20 and his PHQ-9 score is 18.  With no further treatment anticipated, a  treatment plan is not being developed at this time.  Current Symptoms/Problems: depression, irritability, staying down all the time, extremely fatigued, has to force himself to do things  Patient Reported Schizophrenia/Schizoaffective Diagnosis in Past: No  Strengths: "I don't know."  Preferences: Unsure  Abilities: Articulate, open, honest  Type of Services Patient Feels are Needed: Not sure  Initial Clinical Notes/Concerns: Patient is a 77yo man who presents in a wheelchair not knowing what he needs, but stating that he cannot do the things he used to do and he wants some help with his depression.  However, later in the assessment, he asks for just a letter to have an emotional support dog.  Mental Health Symptoms Depression:   Change in energy/activity; Difficulty Concentrating; Fatigue; Hopelessness; Increase/decrease in appetite; Sleep (too much or little); Weight gain/loss; Worthlessness   Duration of Depressive symptoms:  Greater than two weeks   Mania:   None   Anxiety:    Difficulty concentrating; Fatigue; Irritability; Restlessness; Sleep; Tension; Worrying   Psychosis:   None   Duration of Psychotic symptoms: No data recorded  Trauma:   None   Obsessions:   None   Compulsions:   None   Inattention:   None   Hyperactivity/Impulsivity:   None   Oppositional/Defiant Behaviors:   None   Emotional Irregularity:   None   Other Mood/Personality Symptoms:  No data recorded   Mental Status Exam Appearance and  self-care  Stature:   Average   Weight:   Thin   Clothing:   Casual   Grooming:   Normal   Cosmetic use:   None   Posture/gait:   Normal   Motor activity:   Not Remarkable   Sensorium  Attention:   Normal   Concentration:   Anxiety interferes   Orientation:   X5   Recall/memory:   Normal   Affect and Mood  Affect:   Appropriate   Mood:   Depressed   Relating  Eye contact:   Normal   Facial expression:    Depressed   Attitude toward examiner:   Cooperative   Thought and Language  Speech flow:  Normal   Thought content:   Appropriate to Mood and Circumstances   Preoccupation:   None   Hallucinations:   None   Organization:  No data recorded  [employer] of Knowledge:   Average   Intelligence:   Average   Abstraction:   Normal   Judgement:   Good   Reality Testing:   Realistic   Insight:   Good   Decision Making:   Normal   Social Functioning  Social Maturity:   Responsible   Social Judgement:   Normal   Stress  Stressors:   Illness   Coping Ability:   Advice worker Deficits:   Activities of daily living   Supports:   Friends/Service system    Religion: Religion/Spirituality Are You A Religious Person?: Yes What is Your Religious Affiliation?: Baptist How Might This Affect Treatment?: No impact  Leisure/Recreation: Leisure / Recreation Do You Have Hobbies?: No  Exercise/Diet: Exercise/Diet Do You Exercise?: No Have You Gained or Lost A Significant Amount of Weight in the Past Six Months?: Yes-Gained Number of Pounds Gained: 20 Number of Pounds Lost?: 40 Do You Follow a Special Diet?: No Do You Have Any Trouble Sleeping?: Yes Explanation of Sleeping Difficulties: Cannot sleep because he is thinking about a lot of things such as not being able to do things anymore.  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 Has Patient ever [place] in Passenger transport manager?: Yes (Describe in comment) ([employer] - [phone] - was in [place] in combat) Did You Receive Any Psychiatric Treatment/Services While in the [employer]?: No  Education: Education Last Grade Completed: 15 Did Teacher, adult education From  [employer]?: Yes Did [place]?: Yes What Type of College Degree Do you Have?: Lacks 15 hours from a college degree Did You Have Any Special Interests In School?: logistics  CCA Family/Childhood History Family and Relationship History: Family history Marital status: Long term relationship Divorced, when?: [place] term relationship, how long?: 32 years What types of issues is patient dealing with in the relationship?: None Does patient have children?: No  Childhood History:  Childhood History By whom was/is the patient raised?: Grandparents Additional childhood history information: Was raised by maternal grandparents.  Never knew father.  Mother was 11-12yo when he was born, thinks his "sister" was actually was mother. Description of patient's relationship with caregiver when they were a child: Great relationship with all. Patient's description of current relationship with people who raised him/her: All are deceased. How were you disciplined when you got in trouble as a child/adolescent?: Whippings Does  patient have siblings?: Yes Number of Siblings: 6 Description of patient's current relationship with siblings: 4 brothers, 2 sisters - they did not grow up together.  Does not know what happened to some of them.  Others are deceased.  He only knows where 1 brother is in another state and they are not close. Did patient suffer any verbal/emotional/physical/sexual abuse as a child?: No Did patient suffer from severe childhood neglect?: No Has patient ever been sexually abused/assaulted/raped as an adolescent or adult?: No Was the patient ever a victim of a crime or a disaster?: No Witnessed domestic violence?: No Has patient been affected by domestic violence as an adult?: No  CCA Substance Use Alcohol/Drug Use: Alcohol / Drug Use Pain Medications: None Prescriptions: See medication list Over the Counter: PRN History of alcohol / drug use?: No history of alcohol / drug  abuse Withdrawal Symptoms: None   Recommendations for Services/Supports/Treatments: Recommendations for Services/Supports/Treatments Recommendations For Services/Supports/Treatments: Intensive In-Home Services, Medication Management  DSM5 Diagnoses: Patient Active Problem List   Diagnosis Date Noted   Acute urinary retention 04/13/2020   Severe protein-calorie malnutrition ([place]) 04/10/2020   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 Patient/Guardian was advised if they have not already done so to contact the registration department to sign all necessary forms in order for [place] to release information regarding their care.   Consent: Patient/Guardian gives verbal consent for treatment and assignment of benefits for services provided during this visit. Patient/Guardian expressed understanding and agreed to proceed.   Maretta Los, LCSW
